# Patient Record
Sex: Male | Born: 1980 | Race: White | Hispanic: No | Marital: Married | Smoking: Never smoker
Health system: Southern US, Community
[De-identification: ages and names within clinical notes are randomized; demographics above are authoritative.]

## PROBLEM LIST (undated history)

## (undated) DIAGNOSIS — K5792 Diverticulitis of intestine, part unspecified, without perforation or abscess without bleeding: Secondary | ICD-10-CM

## (undated) HISTORY — DX: Diverticulitis of intestine, part unspecified, without perforation or abscess without bleeding: K57.92

## (undated) HISTORY — PX: EYE SURGERY: SHX253

## (undated) HISTORY — PX: WISDOM TOOTH EXTRACTION: SHX21

---

## 2003-03-09 ENCOUNTER — Emergency Department (HOSPITAL_COMMUNITY): Admission: EM | Admit: 2003-03-09 | Discharge: 2003-03-09 | Payer: Self-pay | Admitting: *Deleted

## 2013-11-29 ENCOUNTER — Encounter (HOSPITAL_COMMUNITY): Payer: Self-pay | Admitting: Emergency Medicine

## 2013-11-29 ENCOUNTER — Emergency Department (HOSPITAL_COMMUNITY)
Admission: EM | Admit: 2013-11-29 | Discharge: 2013-11-29 | Disposition: A | Discharge status: Home / Self Care | Payer: BC Managed Care – PPO | Source: Home / Self Care | Attending: Family Medicine | Admitting: Family Medicine

## 2013-11-29 DIAGNOSIS — K5792 Diverticulitis of intestine, part unspecified, without perforation or abscess without bleeding: Secondary | ICD-10-CM

## 2013-11-29 DIAGNOSIS — K5732 Diverticulitis of large intestine without perforation or abscess without bleeding: Secondary | ICD-10-CM

## 2013-11-29 LAB — COMPREHENSIVE METABOLIC PANEL
ALBUMIN: 3.9 g/dL (ref 3.5–5.2)
ALK PHOS: 57 U/L (ref 39–117)
ALT: 17 U/L (ref 0–53)
AST: 25 U/L (ref 0–37)
BILIRUBIN TOTAL: 0.8 mg/dL (ref 0.3–1.2)
BUN: 12 mg/dL (ref 6–23)
CHLORIDE: 105 meq/L (ref 96–112)
CO2: 26 mEq/L (ref 19–32)
Calcium: 9.1 mg/dL (ref 8.4–10.5)
Creatinine, Ser: 0.87 mg/dL (ref 0.50–1.35)
GFR calc Af Amer: >90 mL/min (ref >90)
GFR calc non Af Amer: >90 mL/min (ref >90)
Glucose, Bld: 78 mg/dL (ref 70–99)
POTASSIUM: 4 meq/L (ref 3.7–5.3)
SODIUM: 142 meq/L (ref 137–147)
Total Protein: 7.6 g/dL (ref 6.0–8.3)

## 2013-11-29 LAB — POCT URINALYSIS DIP (DEVICE)
BILIRUBIN URINE: NEGATIVE
Glucose, UA: NEGATIVE mg/dL
Hgb urine dipstick: NEGATIVE
Ketones, ur: NEGATIVE mg/dL
LEUKOCYTES UA: NEGATIVE
NITRITE: NEGATIVE
Protein, ur: 30 mg/dL — AB
Specific Gravity, Urine: 1.025 (ref 1.005–1.030)
Urobilinogen, UA: 0.2 mg/dL (ref 0.0–1.0)
pH: 6.5 (ref 5.0–8.0)

## 2013-11-29 LAB — CBC
HCT: 44 % (ref 39.0–52.0)
HEMOGLOBIN: 16.2 g/dL (ref 13.0–17.0)
MCH: 31.2 pg (ref 26.0–34.0)
MCHC: 36.8 g/dL — ABNORMAL HIGH (ref 30.0–36.0)
MCV: 84.6 fL (ref 78.0–100.0)
Platelets: 196 10*3/uL (ref 150–400)
RBC: 5.2 MIL/uL (ref 4.22–5.81)
RDW: 12.4 % (ref 11.5–15.5)
WBC: 7.5 10*3/uL (ref 4.0–10.5)

## 2013-11-29 MED ORDER — CIPROFLOXACIN HCL 500 MG PO TABS: 500.0000 mg | ORAL_TABLET | Freq: Two times a day (BID) | ORAL | Status: DC

## 2013-11-29 MED ORDER — METRONIDAZOLE 500 MG PO TABS: 500.0000 mg | ORAL_TABLET | Freq: Three times a day (TID) | ORAL | Status: DC

## 2013-11-29 NOTE — ED Notes (Addendum)
C/O gradual onset LLQ pain 3 days ago.  Pain constant but at times becomes "stabbing" with any movement or palpation.  Denies n/v/d, fevers.  Last BM yesterday - states was normal; denies constipation.  States normally does a lot of lifting at work.

## 2013-11-29 NOTE — Discharge Instructions (Signed)
Thank you for coming in today. Clear liquid diet.  Antibiotics.  Follow up with a doctor soon.  If your belly pain worsens, or you have high fever, bad vomiting, blood in your stool or black tarry stool go to the Emergency Room.    Diverticulitis A diverticulum is a small pouch or sac on the colon. Diverticulosis is the presence of these diverticula on the colon. Diverticulitis is the irritation (inflammation) or infection of diverticula. CAUSES  The colon and its diverticula contain bacteria. If food particles block the tiny opening to a diverticulum, the bacteria inside can grow and cause an increase in pressure. This leads to infection and inflammation and is called diverticulitis. SYMPTOMS   Abdominal pain and tenderness. Usually, the pain is located on the left side of your abdomen. However, it could be located elsewhere.  Fever.  Bloating.  Feeling sick to your stomach (nausea).  Throwing up (vomiting).  Abnormal stools. DIAGNOSIS  Your caregiver will take a history and perform a physical exam. Since many things can cause abdominal pain, other tests may be necessary. Tests may include:  Blood tests.  Urine tests.  X-ray of the abdomen.  CT scan of the abdomen. Sometimes, surgery is needed to determine if diverticulitis or other conditions are causing your symptoms. TREATMENT  Most of the time, you can be treated without surgery. Treatment includes:  Resting the bowels by only having liquids for a few days. As you improve, you will need to eat a low-fiber diet.  Intravenous (IV) fluids if you are losing body fluids (dehydrated).  Antibiotic medicines that treat infections may be given.  Pain and nausea medicine, if needed.  Surgery if the inflamed diverticulum has burst. HOME CARE INSTRUCTIONS   Try a clear liquid diet (broth, tea, or water for as long as directed by your caregiver). You may then gradually begin a low-fiber diet as tolerated.  A low-fiber diet  is a diet with less than 10 grams of fiber. Choose the foods below to reduce fiber in the diet:  White breads, cereals, rice, and pasta.  Cooked fruits and vegetables or soft fresh fruits and vegetables without the skin.  Ground or well-cooked tender beef, ham, veal, lamb, pork, or poultry.  Eggs and seafood.  After your diverticulitis symptoms have improved, your caregiver may put you on a high-fiber diet. A high-fiber diet includes 14 grams of fiber for every 1000 calories consumed. For a standard 2000 calorie diet, you would need 28 grams of fiber. Follow these diet guidelines to help you increase the fiber in your diet. It is important to slowly increase the amount fiber in your diet to avoid gas, constipation, and bloating.  Choose whole-grain breads, cereals, pasta, and brown rice.  Choose fresh fruits and vegetables with the skin on. Do not overcook vegetables because the more vegetables are cooked, the more fiber is lost.  Choose more nuts, seeds, legumes, dried peas, beans, and lentils.  Look for food products that have greater than 3 grams of fiber per serving on the Nutrition Facts label.  Take all medicine as directed by your caregiver.  If your caregiver has given you a follow-up appointment, it is very important that you go. Not going could result in lasting (chronic) or permanent injury, pain, and disability. If there is any problem keeping the appointment, call to reschedule. SEEK MEDICAL CARE IF:   Your pain does not improve.  You have a hard time advancing your diet beyond clear liquids.  Your  bowel movements do not return to normal. SEEK IMMEDIATE MEDICAL CARE IF:   Your pain becomes worse.  You have an oral temperature above 102 F (38.9 C), not controlled by medicine.  You have repeated vomiting.  You have bloody or black, tarry stools.  Symptoms that brought you to your caregiver become worse or are not getting better. MAKE SURE YOU:   Understand  these instructions.  Will watch your condition.  Will get help right away if you are not doing well or get worse. Document Released: 04/25/2005 Document Revised: 10/08/2011 Document Reviewed: 08/21/2010 [facility] Patient Information 2014 [place].

## 2013-11-29 NOTE — ED Provider Notes (Signed)
Juan Rodriguez is a 33 y.o. male who presents to Urgent Care today for left lower quadrant abdominal pain present for the last 4 days. No fevers chills nausea vomiting diarrhea. No blood in the stool. Patient had a normal bowel movement yesterday. He denies any personal history of similar episodes. His mother has Crohn's disease. He is otherwise healthy. He has not tried any medications yet.   History reviewed. No pertinent past medical history. History  Substance Use Topics  . Smoking status: Never Smoker   . Smokeless tobacco: Not on file  . Alcohol Use: Yes     Comment: occasional   ROS as above Medications: No current facility-administered medications for this encounter.   Current Outpatient Prescriptions  Medication Sig Dispense Refill  . ciprofloxacin (CIPRO) 500 MG tablet Take 1 tablet (500 mg total) by mouth 2 (two) times daily.  20 tablet  0  . metroNIDAZOLE (FLAGYL) 500 MG tablet Take 1 tablet (500 mg total) by mouth 3 (three) times daily.  30 tablet  0    Exam:  BP 125/80  Pulse 75  Temp(Src) 98.5 F (36.9 C) (Oral)  Resp 16  SpO2 100% Gen: Well NAD HEENT: EOMI,  MMM Lungs: Normal work of breathing. CTABL Heart: RRR no MRG Abd: NABS, Soft. Tender palpation left lower quadrant without significant guarding or rebound. Nondistended Exts: Brisk capillary refill, warm and well perfused.   Results for orders placed during the hospital encounter of 11/29/13 (from the past 24 hour(s))  POCT URINALYSIS DIP (DEVICE)     Status: Abnormal   Collection Time    11/29/13  3:58 PM      Result Value Ref Range   Glucose, UA NEGATIVE  NEGATIVE mg/dL   Bilirubin Urine NEGATIVE  NEGATIVE   Ketones, ur NEGATIVE  NEGATIVE mg/dL   Specific Gravity, Urine 1.025  1.005 - 1.030   Hgb urine dipstick NEGATIVE  NEGATIVE   pH 6.5  5.0 - 8.0   Protein, ur 30 (*) NEGATIVE mg/dL   Urobilinogen, UA 0.2  0.0 - 1.0 mg/dL   Nitrite NEGATIVE  NEGATIVE   Leukocytes, UA NEGATIVE  NEGATIVE  CBC      Status: Abnormal   Collection Time    11/29/13  4:01 PM      Result Value Ref Range   WBC 7.5  4.0 - 10.5 K/uL   RBC 5.20  4.22 - 5.81 MIL/uL   Hemoglobin 16.2  13.0 - 17.0 g/dL   HCT 44.0  39.0 - 52.0 %   MCV 84.6  78.0 - 100.0 fL   MCH 31.2  26.0 - 34.0 pg   MCHC 36.8 (*) 30.0 - 36.0 g/dL   RDW 12.4  11.5 - 15.5 %   Platelets 196  150 - 400 K/uL  COMPREHENSIVE METABOLIC PANEL     Status: None   Collection Time    11/29/13  4:01 PM      Result Value Ref Range   Sodium 142  137 - 147 mEq/L   Potassium 4.0  3.7 - 5.3 mEq/L   Chloride 105  96 - 112 mEq/L   CO2 26  19 - 32 mEq/L   Glucose, Bld 78  70 - 99 mg/dL   BUN 12  6 - 23 mg/dL   Creatinine, Ser 0.87  0.50 - 1.35 mg/dL   Calcium 9.1  8.4 - 10.5 mg/dL   Total Protein 7.6  6.0 - 8.3 g/dL   Albumin 3.9  3.5 - 5.2  g/dL   AST 25  0 - 37 U/L   ALT 17  0 - 53 U/L   Alkaline Phosphatase 57  39 - 117 U/L   Total Bilirubin 0.8  0.3 - 1.2 mg/dL   GFR calc non Af Amer >90  >90 mL/min   GFR calc Af Amer >90  >90 mL/min   No results found.  Assessment and Plan: 33 y.o. male with left lower quadrant abdominal pain. Likely diverticulitis. Plan to treat with Cipro and Flagyl. Followup with primary care provider or gastroenterology. She does have a family history significant for Crohn's disease. If this does not resolve referral to gastroenterology is reasonable.  Discussed warning signs or symptoms. Please see discharge instructions. Patient expresses understanding.    Gregor Hams, MD 11/29/13 [phone]

## 2015-01-05 ENCOUNTER — Encounter: Payer: Self-pay | Admitting: Family

## 2015-01-05 ENCOUNTER — Telehealth: Payer: Self-pay | Admitting: *Deleted

## 2015-01-05 ENCOUNTER — Ambulatory Visit (INDEPENDENT_AMBULATORY_CARE_PROVIDER_SITE_OTHER): Payer: BLUE CROSS/BLUE SHIELD | Admitting: Family

## 2015-01-05 VITALS — BP 122/89 | HR 60 | Temp 98.3°F | Resp 10 | Ht 68.0 in | Wt 208.0 lb

## 2015-01-05 DIAGNOSIS — R1032 Left lower quadrant pain: Secondary | ICD-10-CM | POA: Insufficient documentation

## 2015-01-05 NOTE — Progress Notes (Signed)
Pre visit review using our clinic review tool, if applicable. No additional management support is needed unless otherwise documented below in the visit note. 

## 2015-01-05 NOTE — Patient Instructions (Addendum)
Thank you for choosing [facility].  Summary/Instructions:  Please stop by the lab on the basement level of the building for your blood work. Your results will be released to [place] (or called to you) after review, usually within 72 hours after test completion. If any changes need to be made, you will be notified at that same time.  Referrals have been made during this visit. You should expect to hear back from our schedulers in about 7-10 days in regards to establishing an appointment with the specialists we discussed.   If your symptoms worsen or fail to improve, please contact our office for further instruction, or in case of emergency go directly to the emergency room at the closest medical facility.    Colitis Colitis is inflammation of the colon. Colitis can be a short-term or long-standing (chronic) illness. Crohn's disease and ulcerative colitis are 2 types of colitis which are chronic. They usually require lifelong treatment. CAUSES  There are many different causes of colitis, including:  Viruses.  Germs (bacteria).  Medicine reactions. SYMPTOMS   Diarrhea.  Intestinal bleeding.  Pain.  Fever.  Throwing up (vomiting).  Tiredness (fatigue).  Weight loss.  Bowel blockage. DIAGNOSIS  The diagnosis of colitis is based on examination and stool or blood tests. X-rays, CT scan, and colonoscopy may also be needed. TREATMENT  Treatment may include:  Fluids given through the vein (intravenously).  Bowel rest (nothing to eat or drink for a period of time).  Medicine for pain and diarrhea.  Medicines (antibiotics) that kill germs.  Cortisone medicines.  Surgery. HOME CARE INSTRUCTIONS   Get plenty of rest.  Drink enough water and fluids to keep your urine clear or pale yellow.  Eat a well-balanced diet.  Call your caregiver for follow-up as recommended. SEEK IMMEDIATE MEDICAL CARE IF:   You develop chills.  You have an oral temperature above  102 F (38.9 C), not controlled by medicine.  You have extreme weakness, fainting, or dehydration.  You have repeated vomiting.  You develop severe belly (abdominal) pain or are passing bloody or tarry stools. MAKE SURE YOU:   Understand these instructions.  Will watch your condition.  Will get help right away if you are not doing well or get worse. Document Released: 08/23/2004 Document Revised: 10/08/2011 Document Reviewed: 11/18/2009 [facility] Patient Information 2015 [place], [place]. This information is not intended to replace advice given to you by your health care provider. Make sure you discuss any questions you have with your health care provider.  Crohn Disease Crohn disease is a long-term (chronic) soreness and redness (inflammation) of the intestines (bowel). It can affect any portion of the digestive tract, from the mouth to the anus. It can also cause problems outside the digestive tract. Crohn disease is closely related to a disease called ulcerative colitis (together, these two diseases are called inflammatory bowel disease).  CAUSES  The cause of Crohn disease is not known. One Link Snuffer is that, in an easily affected person, the immune system is triggered to attack the body's own digestive tissue. Crohn disease runs in families. It seems to be more common in certain geographic areas and amongst certain races. There are no clear-cut dietary causes.  SYMPTOMS  Crohn disease can cause many different symptoms since it can affect many different parts of the body. Symptoms include:  Fatigue.  Weight loss.  Chronic diarrhea, sometime bloody.  Abdominal pain and cramps.  Fever.  Ulcers or canker sores in the mouth or rectum.  Anemia (  low red blood cells).  Arthritis, skin problems, and eye problems may occur. Complications of Crohn disease can include:  Series of holes (perforation) of the bowel.  Portions of the intestines sticking to each other  (adhesions).  Obstruction of the bowel.  Fistula formation, typically in the rectal area but also in other areas. A fistula is an opening between the bowels and the outside, or between the bowels and another organ.  A painful crack in the mucous membrane of the anus (rectal fissure). DIAGNOSIS  Your caregiver may suspect Crohn disease based on your symptoms and an exam. Blood tests may confirm that there is a problem. You may be asked to submit a stool specimen for examination. X-rays and CT scans may be necessary. Ultimately, the diagnosis is usually made after a procedure that uses a flexible tube that is inserted via your mouth or your anus. This is done under sedation and is called either an upper endoscopy or colonoscopy. With these tests, the specialist can take tiny tissue samples and remove them from the inside of the bowel (biopsy). Examination of this biopsy tissue under a microscope can reveal Crohn disease as the cause of your symptoms. Due to the many different forms that Crohn disease can take, symptoms may be present for several years before a diagnosis is made. TREATMENT  Medications are often used to decrease inflammation and control the immune system. These include medicines related to aspirin, steroid medications, and newer and stronger medications to slow down the immune system. Some medications may be used as suppositories or enemas. A number of other medications are used or have been studied. Your caregiver will make specific recommendations. HOME CARE INSTRUCTIONS   Symptoms such as diarrhea can be controlled with medications. Avoid foods that have a laxative effect such as fresh fruit, vegetables, and dairy products. During flare-ups, you can rest your bowel by refraining from solid foods. Drink clear liquids frequently during the day. (Electrolyte or rehydrating fluids are best. Your caregiver can help you with suggestions.) Drink often to prevent loss of body fluids  (dehydration). When diarrhea has cleared, eat small meals and more frequently. Avoid food additives and stimulants such as caffeine (coffee, tea, or chocolate). Enzyme supplements may help if you develop intolerance to a sugar in dairy products (lactose). Ask your caregiver or dietitian about specific dietary instructions.  Try to maintain a positive attitude. Learn relaxation techniques such as self-hypnosis, mental imaging, and muscle relaxation.  If possible, avoid stresses which can aggravate your condition.  Exercise regularly.  Follow your diet.  Always get plenty of rest. SEEK MEDICAL CARE IF:   Your symptoms fail to improve after a week or two of new treatment.  You experience continued weight loss.  You have ongoing cramps or loose bowels.  You develop a new skin rash, skin sores, or eye problems. SEEK IMMEDIATE MEDICAL CARE IF:   You have worsening of your symptoms or develop new symptoms.  You have a fever.  You develop bloody diarrhea.  You develop severe abdominal pain. MAKE SURE YOU:   Understand these instructions.  Will watch your condition.  Will get help right away if you are not doing well or get worse. Document Released: 04/25/2005 Document Revised: 11/30/2013 Document Reviewed: 03/24/2007 [facility] Patient Information 2015 [place], [place]. This information is not intended to replace advice given to you by your health care provider. Make sure you discuss any questions you have with your health care provider.  Ulcerative Colitis Ulcerative colitis is  a long lasting swelling and soreness (inflammation) of the colon (large intestine). In patients with ulcerative colitis, sores (ulcers) and inflammation of the inner lining of the colon lead to illness. Ulcerative colitis can also cause problems outside the digestive tract.  Ulcerative colitis is closely related to another condition of inflammation of the intestines called Crohn's disease. Together, they are  frequently referred to as inflammatory bowel disease (IBD). Ulcerative colitis and Crohn's diseases are conditions that can last years to decades. Men and women are affected equally. They most commonly begin during adolescence and early adulthood. SYMPTOMS  Common symptoms of ulcerative colitis include rectal bleeding and diarrhea. There is a wide range of symptoms among patients with this disease depending on how severe the disease is. Some of these symptoms are:  Abdominal pain or cramping.  Diarrhea.  Fever.  Tiredness (fatigue).  Weight loss.  Night sweats.  Rectal pain.  Feeling the immediate need to have a bowel movement (rectal urgency). CAUSES  Ulcerative colitis is caused by increased activity of the immune system in the intestines. The immune system is the system that protects the body against disease such as harmful bacteria, viruses, fungi, and other foreign invaders. When the immune system overacts, it causes inflammation. The cause of the increased immune system activity is not known. This over activity causes long-lasting inflammation and ulceration. This condition may be passed down from your parents (inherited). Brothers, sisters, children, and parents of patients with IBD are more likely to develop these diseases. It is not contagious. This means you cannot catch it from someone else. DIAGNOSIS  Your caregiver may suspect ulcerative colitis based on your symptoms and exam. Blood tests may confirm that there is a problem. You may be asked to submit a stool specimen for examination. X-rays and CT scans may be necessary. Ultimately, the diagnosis is usually made after a flexible tube is inserted via your anus and your colon is examined under sedation (colonoscopy). With this test, the specialist can take a tiny tissue sample from inside the bowel (biopsy). Examination of this biopsy tissue under a microscopy can reveal ulcerative colitis as the cause of your symptoms. TREATMENT    There is no cure for ulcerative colitis.  Complications such as massive bleeding from the colon (hemorrhage), development of a hole in the colon (perforation), or the development of precancerous or cancerous changes of the colon may require surgery.  Medications are often used to decrease inflammation and control the immune system. These include medicines related to aspirin, steroid medications, and newer and stronger medications to slow down the immune system. Some medications may be used as suppositories or enemas. A number of other medications are used or have been studied. Your caregiver will make specific recommendations. HOME CARE INSTRUCTIONS   There is no cure for ulcerative colitis disease. The best treatment is frequent checkups with your caregiver. Periodic reevaluation is important.  Symptoms such as diarrhea can be controlled with medications. Avoid foods that have a laxative effect such fresh fruit and vegetables and dairy products. During flare ups, you can rest your bowel by staying away from solid foods. Drink clear liquids frequently during the day. Electrolyte or rehydrating fluids are best. Your caregiver can help you with suggestions. Drink often to prevent dehydration. When diarrhea has cleared, eat smaller meals and more often. Avoid food additives and stimulants such as caffeine (coffee, tea, many sodas, or chocolate). Avoid dairy products. Enzyme supplements may help if you develop intolerance to a sugar in dairy  products (lactose). Ask your caregiver or dietitian about specific dietary instructions.  If you had surgery, be sure you understand your care instructions thoroughly, including proper care of any surgical wounds.  Take any medications exactly as prescribed.  Try to maintain a positive attitude. Learn relaxation techniques such as self hypnosis, mental imaging, and muscle relaxation. If possible, avoid stresses that aggravate your condition. Exercise regularly.  Follow your diet. Always get plenty of rest. SEEK MEDICAL CARE IF:   Your symptoms fail to improve after a week or two of new treatment.  You experience continued weight loss.  You have ongoing crampy digestion or loose bowels.  You develop a new skin rash, skin sores, or eye problems. SEEK IMMEDIATE MEDICAL CARE IF:   You have worsening of your symptoms or develop new symptoms.  You have an oral temperature above 102 F (38.9 C), not controlled by medicine.  You develop bloody diarrhea.  You have severe abdominal pain. Document Released: 04/25/2005 Document Revised: 10/08/2011 Document Reviewed: 03/25/2007 [facility] Patient Information 2015 [place], [place]. This information is not intended to replace advice given to you by your health care provider. Make sure you discuss any questions you have with your health care provider.  Diverticulitis Diverticulitis is inflammation or infection of small pouches in your colon that form when you have a condition called diverticulosis. The pouches in your colon are called diverticula. Your colon, or large intestine, is where water is absorbed and stool is formed. Complications of diverticulitis can include:  Bleeding.  Severe infection.  Severe pain.  Perforation of your colon.  Obstruction of your colon. CAUSES  Diverticulitis is caused by bacteria. Diverticulitis happens when stool becomes trapped in diverticula. This allows bacteria to grow in the diverticula, which can lead to inflammation and infection. RISK FACTORS People with diverticulosis are at risk for diverticulitis. Eating a diet that does not include enough fiber from fruits and vegetables may make diverticulitis more likely to develop. SYMPTOMS  Symptoms of diverticulitis may include:  Abdominal pain and tenderness. The pain is normally located on the left side of the abdomen, but may occur in other areas.  Fever and  chills.  Bloating.  Cramping.  Nausea.  Vomiting.  Constipation.  Diarrhea.  Blood in your stool. DIAGNOSIS  Your health care provider will ask you about your medical history and do a physical exam. You may need to have tests done because many medical conditions can cause the same symptoms as diverticulitis. Tests may include:  Blood tests.  Urine tests.  Imaging tests of the abdomen, including X-rays and CT scans. When your condition is under control, your health care provider may recommend that you have a colonoscopy. A colonoscopy can show how severe your diverticula are and whether something else is causing your symptoms. TREATMENT  Most cases of diverticulitis are mild and can be treated at home. Treatment may include:  Taking over-the-counter pain medicines.  Following a clear liquid diet.  Taking antibiotic medicines by mouth for 7-10 days. More severe cases may be treated at a hospital. Treatment may include:  Not eating or drinking.  Taking prescription pain medicine.  Receiving antibiotic medicines through an IV tube.  Receiving fluids and nutrition through an IV tube.  Surgery. HOME CARE INSTRUCTIONS   Follow your health care provider's instructions carefully.  Follow a full liquid diet or other diet as directed by your health care provider. After your symptoms improve, your health care provider may tell you to change your diet. He  or she may recommend you eat a high-fiber diet. Fruits and vegetables are good sources of fiber. Fiber makes it easier to pass stool.  Take fiber supplements or probiotics as directed by your health care provider.  Only take medicines as directed by your health care provider.  Keep all your follow-up appointments. SEEK MEDICAL CARE IF:   Your pain does not improve.  You have a hard time eating food.  Your bowel movements do not return to normal. SEEK IMMEDIATE MEDICAL CARE IF:   Your pain becomes worse.  Your  symptoms do not get better.  Your symptoms suddenly get worse.  You have a fever.  You have repeated vomiting.  You have bloody or black, tarry stools. MAKE SURE YOU:   Understand these instructions.  Will watch your condition.  Will get help right away if you are not doing well or get worse. Document Released: 04/25/2005 Document Revised: 07/21/2013 Document Reviewed: 06/10/2013 [facility] Patient Information 2015 [place], [place]. This information is not intended to replace advice given to you by your health care provider. Make sure you discuss any questions you have with your health care provider.

## 2015-01-05 NOTE — Progress Notes (Deleted)
   Subjective:    Patient ID: Juan Rodriguez, male    DOB: 1980-09-15, 34 y.o.   MRN: [id number]  HPI    Review of Systems     Objective:   Physical Exam        Assessment & Plan:

## 2015-01-05 NOTE — Telephone Encounter (Signed)
Sure, or before if cancellation.

## 2015-01-05 NOTE — Telephone Encounter (Signed)
Urgent referral for patient from Mauricio Po, NP. Patient has LLQ pain, weight loss, diarrhea. No appointments available with MD or extender until 01/14/15. DOD- Dr. Olevia Perches. Is this ok?

## 2015-01-05 NOTE — Progress Notes (Signed)
Subjective:    Patient ID: Juan Rodriguez, male    DOB: 1981-06-29, 34 y.o.   MRN: [id number]  Chief Complaint  Patient presents with  . Flank Pain    Left Lower Quadrant    HPI:  Juan Rodriguez is a 34 y.o. male with a PMH of diverticulitis who presents today for an office visit to establish care.  1.) Flank pain - Previously diagnosed with diverticulitis last year when he was seen at Urgent Care and treated with Ciprofloxacin and Metronidazole. Notes that the symptoms went away following treatment. Recently was seen in an ED for the LLQ pain and a CT scan reveled inflammation consistent with colitis and possible ulcerative colitis. He was treated once again with Ciprofloxacin and Metronidzale which helped decrease the pain, however continues to experience the associated symptoms of pain located in the lower left quadrant that is described as a feeling like being "punched in the gut." Severity of the pain is rated a 2/10.  Up until yesterday, he was having up to 10 bowel movements per day and now about 5-6 bowel movements per day. Has lost approximately 25 pounds since this initially started.     No Known Allergies   Outpatient Prescriptions Prior to Visit  Medication Sig Dispense Refill  . ciprofloxacin (CIPRO) 500 MG tablet Take 1 tablet (500 mg total) by mouth 2 (two) times daily. (Patient not taking: Reported on 01/05/2015) 20 tablet 0  . metroNIDAZOLE (FLAGYL) 500 MG tablet Take 1 tablet (500 mg total) by mouth 3 (three) times daily. (Patient not taking: Reported on 01/05/2015) 30 tablet 0   No facility-administered medications prior to visit.     Past Medical History  Diagnosis Date  . Diverticulitis      Past Surgical History  Procedure Laterality Date  . Eye surgery      Lasix     Family History  Problem Relation Age of Onset  . Crohn's disease Mother   . Healthy Father   . Alzheimer's disease Maternal Grandmother   . Healthy Paternal Grandmother      History    Social History  . Marital Status: Married    Spouse Name: N/A  . Number of Children: 2  . Years of Education: 14   Occupational History  . Glass blower/designer    Social History Main Topics  . Smoking status: Never Smoker   . Smokeless tobacco: Never Used  . Alcohol Use: Yes     Comment: occasional  . Drug Use: No  . Sexual Activity: Yes   Other Topics Concern  . Not on file   Social History Narrative   Fun: Whatever his kids want to do.   Kids ages 31 & 80   Denies religious beliefs effecting health care.      Review of Systems  Constitutional: Negative for fever and chills.  Gastrointestinal: Positive for abdominal pain, diarrhea and abdominal distention.      Objective:    BP 122/89 mmHg  Pulse 60  Temp(Src) 98.3 F (36.8 C) (Oral)  Resp 10  Ht 5\' 8"  (1.727 m)  Wt 208 lb (94.348 kg)  BMI 31.63 kg/m2 Nursing note and vital signs reviewed.  Physical Exam  Constitutional: He is oriented to person, place, and time. He appears well-developed and well-nourished. No distress.  Cardiovascular: Normal rate, regular rhythm, normal heart sounds and intact distal pulses.   Pulmonary/Chest: Effort normal and breath sounds normal.  Abdominal: Normal appearance and bowel sounds are normal.  He exhibits no mass. There is no hepatosplenomegaly. There is tenderness in the periumbilical area and left lower quadrant. There is no rigidity, no rebound, no guarding, no tenderness at McBurney's point and negative Murphy's sign.  Neurological: He is alert and oriented to person, place, and time.  Skin: Skin is warm and dry.  Psychiatric: He has a normal mood and affect. His behavior is normal. Judgment and thought content normal.       Assessment & Plan:   Problem List Items Addressed This Visit      Other   LLQ pain - Primary    Symptoms and exam consistent with colitis that was seen on CT scan through an emergency room visit at an outside hospital. Obtain basic metabolic panel  check her kidney function and electrolytes. Hydrate with sports drinks and water as needed. Referred to gastroenterology for further management and potential colonoscopy. Patient advised to seek further emergency care if symptoms of dehydration develop or diarrhea worsens.      Relevant Orders   Basic Metabolic Panel (BMET)   Ambulatory referral to Gastroenterology

## 2015-01-05 NOTE — Telephone Encounter (Signed)
Scheduled with Nicoletta Ba, PA on 01/14/15 at 2:00 PM. Dr. Olevia Perches DOD says this is ok.

## 2015-01-05 NOTE — Assessment & Plan Note (Signed)
Symptoms and exam consistent with colitis that was seen on CT scan through an emergency room visit at an outside hospital. Obtain basic metabolic panel check her kidney function and electrolytes. Hydrate with sports drinks and water as needed. Referred to gastroenterology for further management and potential colonoscopy. Patient advised to seek further emergency care if symptoms of dehydration develop or diarrhea worsens.

## 2015-01-06 NOTE — Telephone Encounter (Signed)
Left a message on answering machine with appointment.

## 2015-01-10 ENCOUNTER — Telehealth: Payer: Self-pay | Admitting: Family

## 2015-01-10 MED ORDER — AMOXICILLIN-POT CLAVULANATE 875-125 MG PO TABS: 1.0000 | ORAL_TABLET | Freq: Two times a day (BID) | ORAL | Status: DC

## 2015-01-10 NOTE — Telephone Encounter (Signed)
Patient ask if you could send a a prescription for an antibiotic to [facility] on [address] in [place]. Please advise

## 2015-01-10 NOTE — Telephone Encounter (Signed)
Pt states that at his last visit you said you would send him an antibiotic for his abdominal pain but he refused it bc he had another docs appointment but he had to reschedule that so he is wanting to see if you would go ahead and send him in the antibiotic you were talking to him about. Please advise.

## 2015-01-10 NOTE — Telephone Encounter (Signed)
Medication sent.

## 2015-01-10 NOTE — Addendum Note (Signed)
Addended by: Mauricio Po D on: 01/10/2015 01:00 PM   Modules accepted: Orders

## 2015-01-14 ENCOUNTER — Encounter: Payer: Self-pay | Admitting: Physician Assistant

## 2015-01-14 ENCOUNTER — Ambulatory Visit (INDEPENDENT_AMBULATORY_CARE_PROVIDER_SITE_OTHER): Payer: BLUE CROSS/BLUE SHIELD | Admitting: Physician Assistant

## 2015-01-14 VITALS — BP 112/70 | Ht 68.0 in | Wt 207.5 lb

## 2015-01-14 DIAGNOSIS — R197 Diarrhea, unspecified: Secondary | ICD-10-CM

## 2015-01-14 DIAGNOSIS — R933 Abnormal findings on diagnostic imaging of other parts of digestive tract: Secondary | ICD-10-CM

## 2015-01-14 DIAGNOSIS — R1032 Left lower quadrant pain: Secondary | ICD-10-CM

## 2015-01-14 DIAGNOSIS — R1031 Right lower quadrant pain: Secondary | ICD-10-CM | POA: Diagnosis not present

## 2015-01-14 DIAGNOSIS — G8929 Other chronic pain: Secondary | ICD-10-CM

## 2015-01-14 HISTORY — DX: Diarrhea, unspecified: R19.7

## 2015-01-14 MED ORDER — NA SULFATE-K SULFATE-MG SULF 17.5-3.13-1.6 GM/177ML PO SOLN: 1.0000 | Freq: Once | ORAL | Status: AC

## 2015-01-14 MED ORDER — DICYCLOMINE HCL 10 MG PO CAPS: 10.0000 mg | ORAL_CAPSULE | Freq: Three times a day (TID) | ORAL | Status: DC

## 2015-01-14 NOTE — Progress Notes (Signed)
Reviewed and agree with management plan.  Ramiro Pangilinan T. Deauna Yaw, MD FACG 

## 2015-01-14 NOTE — Progress Notes (Signed)
Patient ID: ALDRICK DERRIG, male   DOB: 1981-07-02, 34 y.o.   MRN: [id number]   Subjective:    Patient ID: SOMA LIZAK, male    DOB: [birth year], 34 y.o.   MRN: [id number]  HPI Juliane Lack is a very nice 34 year old white male generally in good health, new to GI today referred by Mauricio Po NP/primary care for evaluation of lower abdominal pain diarrhea and weight loss. Patient states that he had an episode of lower abdominal pain about a year ago and at that time was treated for possible diverticulitis. About a month ago he was vacationing in [place] and developed left lower abdominal pain and diarrhea and was seen at an urgent care there. He underwent CT of the abdomen and pelvis which by the primary care notes did not show diverticulitis that did show colitis with concern for ulcerative colitis. This report has been sent to be scanned and I have not been able to review the report myself. The patient had been given a course of Cipro and Flagyl through that urgent care he says he gradually did feel better had no associated fever or chills no rectal bleeding no nausea or vomiting. Within a week of finishing the antibiotics he had recurrence of lower abdominal discomfort and says that his diarrhea has never really changed since onset. He was seen by primary care earlier this week and placed on Augmentin. He says is generally having 10-12 loose bowel movements per day which are urgent and nonbloody has no significant pain but has crampy lower abdominal discomfort. He says his lost about 20 pounds over the past month and a half as he is eating much less knowing that he will have abdominal discomfort and diarrhea after eating. Patient does have family history of Crohn's disease in his mom.  Review of Systems Pertinent positive and negative review of systems were noted in the above HPI section.  All other review of systems was otherwise negative.  Outpatient Encounter Prescriptions as of  01/14/2015  Medication Sig  . amoxicillin-clavulanate (AUGMENTIN) 875-125 MG per tablet Take 1 tablet by mouth 2 (two) times daily.  Marland Kitchen dicyclomine (BENTYL) 10 MG capsule Take 1 capsule (10 mg total) by mouth 3 (three) times daily before meals.  . Na Sulfate-K Sulfate-Mg Sulf SOLN Take 1 kit by mouth once.   No facility-administered encounter medications on file as of 01/14/2015.   No Known Allergies Patient Active Problem List   Diagnosis Date Noted  . Diarrhea 01/14/2015  . LLQ pain 01/05/2015   History   Social History  . Marital Status: Married    Spouse Name: N/A  . Number of Children: 2  . Years of Education: 14   Occupational History  . Glass blower/designer    Social History Main Topics  . Smoking status: Never Smoker   . Smokeless tobacco: Never Used  . Alcohol Use: Yes     Comment: occasional  . Drug Use: No  . Sexual Activity: Yes   Other Topics Concern  . Not on file   Social History Narrative   Fun: Whatever his kids want to do.   Kids ages 27 & 79   Denies religious beliefs effecting health care.     Mr. Boyte family history includes Alzheimer's disease in his maternal grandmother; Crohn's disease in his mother; Healthy in his father and paternal grandmother.      Objective:    Filed Vitals:   01/14/15 1335  BP: 112/70    Physical  Exam    well-developed white male in no acute distress, pleasant blood pressure 112/70 pulse in the 70s height 5 foot 8 weight 207 BMI 31. HEENT; nontraumatic normocephalic EOMI PERRLA sclera anicteric, Supple ;no JVD, Cardiovascular; regular rate and rhythm with S1-S2 no murmur or gallop, Pulmonary; clear bilaterally, Abdomen; soft bowel sounds are present he is tender in the lower abdomen right greater than left no guarding or rebound no palpable mass or hepatosplenomegaly, Rectal ;exam not done, Ext; no clubbing cyanosis or edema skin warm and dry, Psych; mood and affect appropriate       Assessment & Plan:   #1 34 yo male  with 5 week hx of persistent lower abdominal  pain/cramping , and diarrhea - by report had abnormal CT scan done in [place] showing colitis concerning for ulcerative colitis/ no diverticulitis. R/O IBD, R/O cdiff  after 2 recent courses of antibiotics.  #2 family hx of IBD- mother with Crohns  Plan; stop Augmentin  start Florastor BID x one month  Start Bentyl 10 mg 3 x daily before meals  Check CBC,BMET,ESR,CRP, stool lactoferrin and stool for Cdiff PCR  Schedule for Colonoscopy with Dr Fuller Plan. Procedure discussed in detail with pt and he is agreeable to proceed    Alfredia Ferguson PA-C 01/14/2015   Cc: Golden Circle, FNP

## 2015-01-14 NOTE — Patient Instructions (Addendum)
Please go to the basement level to have your labs drawn and stool studies. We sent  prescriptions to [facility] [place]. 1. Suprep for the colonoscopy 2.  Bentyl ( Dicyclomine).   Use Imodium as needed . Stop the augmentin antibiortic.Marland Kitchen Push fluids and eat a bland diet for now.

## 2015-03-21 ENCOUNTER — Encounter: Payer: Self-pay | Admitting: Gastroenterology

## 2015-03-21 ENCOUNTER — Ambulatory Visit (AMBULATORY_SURGERY_CENTER): Payer: BLUE CROSS/BLUE SHIELD | Admitting: Gastroenterology

## 2015-03-21 VITALS — BP 111/71 | HR 65 | Temp 97.3°F | Resp 18 | Ht 68.0 in | Wt 207.0 lb

## 2015-03-21 DIAGNOSIS — R933 Abnormal findings on diagnostic imaging of other parts of digestive tract: Secondary | ICD-10-CM | POA: Diagnosis present

## 2015-03-21 DIAGNOSIS — R197 Diarrhea, unspecified: Secondary | ICD-10-CM

## 2015-03-21 DIAGNOSIS — R1032 Left lower quadrant pain: Secondary | ICD-10-CM | POA: Diagnosis not present

## 2015-03-21 DIAGNOSIS — R198 Other specified symptoms and signs involving the digestive system and abdomen: Secondary | ICD-10-CM

## 2015-03-21 DIAGNOSIS — R1031 Right lower quadrant pain: Secondary | ICD-10-CM

## 2015-03-21 MED ORDER — SODIUM CHLORIDE 0.9 % IV SOLN: 500.0000 mL | INTRAVENOUS | Status: DC

## 2015-03-21 NOTE — Op Note (Signed)
[place]  [address]. [place], [id number]   COLONOSCOPY PROCEDURE REPORT  PATIENT: Juan Rodriguez, Juan Rodriguez  MR#: [id number] BIRTHDATE: 1980-11-25 , 34  yrs. old GENDER: male ENDOSCOPIST: Ladene Artist, MD, [facility] REFERRED BY: Mauricio Po, FNP PROCEDURE DATE:  03/21/2015 PROCEDURE:   Colonoscopy, diagnostic First Screening Colonoscopy - Avg.  risk and is 50 yrs.  old or older - No.  Prior Negative Screening - Now for repeat screening. N/A  History of Adenoma - Now for follow-up colonoscopy & has been > or = to 3 yrs.  N/A  Polyps removed today? No Recommend repeat exam, <10 yrs? No ASA CLASS:   Class II INDICATIONS:Clinically significant diarrhea of unexplained origin, Colorectal Neoplasm Risk Assessment for this procedure is average risk, and lower abdominal pain, abnormal CT of colon. MEDICATIONS: Monitored anesthesia care and Propofol 200 mg IV DESCRIPTION OF PROCEDURE:   After the risks benefits and alternatives of the procedure were thoroughly explained, informed consent was obtained.  The digital rectal exam revealed no abnormalities of the rectum.   The LB [id number]  endoscope was introduced through the anus and advanced to the terminal ileum which was intubated for a short distance. No adverse events experienced.   The quality of the prep was excellent.  (Suprep was used)  The instrument was then slowly withdrawn as the colon was fully examined. Estimated blood loss is zero unless otherwise noted in this procedure report.    COLON FINDINGS: A normal appearing cecum, ileocecal valve, and appendiceal orifice were identified.  The ascending, transverse, descending, sigmoid colon, and rectum appeared unremarkable.   The examined terminal ileum appeared to be normal.  Retroflexed views revealed no abnormalities. The time to cecum = 1.1 Withdrawal time = 7.5   The scope was withdrawn and the procedure completed. COMPLICATIONS: There were no immediate  complications.  ENDOSCOPIC IMPRESSION: 1.   Normal colonoscopy 2.   The examined terminal ileum appeared normal  RECOMMENDATIONS: 1. Continue current colorectal screening recommendations for "routine risk" patients with a repeat colonoscopy at age 72. 2. GI follow as needed  eSigned:  Ladene Artist, MD, [facility] 03/21/2015 8:20 AM

## 2015-03-21 NOTE — Patient Instructions (Signed)
Discharge instructions given. Normal exam. Resume previous medications. YOU HAD AN ENDOSCOPIC PROCEDURE TODAY AT THE [facility]:   Refer to the procedure report that was given to you for any specific questions about what was found during the examination.  If the procedure report does not answer your questions, please call your gastroenterologist to clarify.  If you requested that your care partner not be given the details of your procedure findings, then the procedure report has been included in a sealed envelope for you to review at your convenience later.  YOU SHOULD EXPECT: Some feelings of bloating in the abdomen. Passage of more gas than usual.  Walking can help get rid of the air that was put into your GI tract during the procedure and reduce the bloating. If you had a lower endoscopy (such as a colonoscopy or flexible sigmoidoscopy) you may notice spotting of blood in your stool or on the toilet paper. If you underwent a bowel prep for your procedure, you may not have a normal bowel movement for a few days.  Please Note:  You might notice some irritation and congestion in your nose or some drainage.  This is from the oxygen used during your procedure.  There is no need for concern and it should clear up in a day or so.  SYMPTOMS TO REPORT IMMEDIATELY:   Following lower endoscopy (colonoscopy or flexible sigmoidoscopy):  Excessive amounts of blood in the stool  Significant tenderness or worsening of abdominal pains  Swelling of the abdomen that is new, acute  Fever of 100F or higher   For urgent or emergent issues, a gastroenterologist can be reached at any hour by calling [phone].   DIET: Your first meal following the procedure should be a small meal and then it is ok to progress to your normal diet. Heavy or fried foods are harder to digest and may make you feel nauseous or bloated.  Likewise, meals heavy in dairy and vegetables can increase bloating.  Drink plenty  of fluids but you should avoid alcoholic beverages for 24 hours.  ACTIVITY:  You should plan to take it easy for the rest of today and you should NOT DRIVE or use heavy machinery until tomorrow (because of the sedation medicines used during the test).    FOLLOW UP: Our staff will call the number listed on your records the next business day following your procedure to check on you and address any questions or concerns that you may have regarding the information given to you following your procedure. If we do not reach you, we will leave a message.  However, if you are feeling well and you are not experiencing any problems, there is no need to return our call.  We will assume that you have returned to your regular daily activities without incident.  If any biopsies were taken you will be contacted by phone or by letter within the next 1-3 weeks.  Please call us at [phone] if you have not heard about the biopsies in 3 weeks.    SIGNATURES/CONFIDENTIALITY: You and/or your care partner have signed paperwork which will be entered into your electronic medical record.  These signatures attest to the fact that that the information above on your After Visit Summary has been reviewed and is understood.  Full responsibility of the confidentiality of this discharge information lies with you and/or your care-partner. 

## 2015-03-21 NOTE — Progress Notes (Signed)
Report to PACU, RN, vss, BBS= Clear.  

## 2015-03-22 ENCOUNTER — Telehealth: Payer: Self-pay

## 2015-03-22 NOTE — Telephone Encounter (Signed)
Left message on answering machine. 

## 2016-10-23 DIAGNOSIS — Z713 Dietary counseling and surveillance: Secondary | ICD-10-CM | POA: Diagnosis not present

## 2016-10-23 DIAGNOSIS — Z0000 Encounter for general adult medical examination without abnormal findings: Secondary | ICD-10-CM | POA: Diagnosis not present

## 2016-10-23 DIAGNOSIS — Z6835 Body mass index (BMI) 35.0-35.9, adult: Secondary | ICD-10-CM | POA: Diagnosis not present

## 2016-10-23 DIAGNOSIS — Z7182 Exercise counseling: Secondary | ICD-10-CM | POA: Diagnosis not present

## 2016-10-23 DIAGNOSIS — Z7689 Persons encountering health services in other specified circumstances: Secondary | ICD-10-CM | POA: Diagnosis not present

## 2016-12-18 DIAGNOSIS — J329 Chronic sinusitis, unspecified: Secondary | ICD-10-CM | POA: Diagnosis not present

## 2016-12-18 DIAGNOSIS — J111 Influenza due to unidentified influenza virus with other respiratory manifestations: Secondary | ICD-10-CM | POA: Diagnosis not present

## 2016-12-18 DIAGNOSIS — J029 Acute pharyngitis, unspecified: Secondary | ICD-10-CM | POA: Diagnosis not present

## 2017-01-17 DIAGNOSIS — L739 Follicular disorder, unspecified: Secondary | ICD-10-CM | POA: Diagnosis not present

## 2017-01-17 DIAGNOSIS — D225 Melanocytic nevi of trunk: Secondary | ICD-10-CM | POA: Diagnosis not present

## 2017-01-17 DIAGNOSIS — D485 Neoplasm of uncertain behavior of skin: Secondary | ICD-10-CM | POA: Diagnosis not present

## 2017-01-17 DIAGNOSIS — D235 Other benign neoplasm of skin of trunk: Secondary | ICD-10-CM | POA: Diagnosis not present

## 2017-06-07 DIAGNOSIS — M545 Low back pain: Secondary | ICD-10-CM | POA: Diagnosis not present

## 2017-06-07 DIAGNOSIS — M9903 Segmental and somatic dysfunction of lumbar region: Secondary | ICD-10-CM | POA: Diagnosis not present

## 2017-06-07 DIAGNOSIS — I10 Essential (primary) hypertension: Secondary | ICD-10-CM | POA: Diagnosis not present

## 2017-06-07 DIAGNOSIS — M9901 Segmental and somatic dysfunction of cervical region: Secondary | ICD-10-CM | POA: Diagnosis not present

## 2017-06-07 DIAGNOSIS — M9902 Segmental and somatic dysfunction of thoracic region: Secondary | ICD-10-CM | POA: Diagnosis not present

## 2018-01-16 ENCOUNTER — Ambulatory Visit (INDEPENDENT_AMBULATORY_CARE_PROVIDER_SITE_OTHER): Payer: Self-pay | Admitting: Nurse Practitioner

## 2018-01-16 VITALS — BP 138/88 | HR 97 | Temp 98.7°F | Resp 18 | Wt 236.4 lb

## 2018-01-16 DIAGNOSIS — Z0000 Encounter for general adult medical examination without abnormal findings: Secondary | ICD-10-CM

## 2018-01-16 NOTE — Patient Instructions (Signed)
Health Maintenance, Male A healthy lifestyle and preventive care is important for your health and wellness. Ask your health care provider about what schedule of regular examinations is right for you. What should I know about weight and diet? Eat a Healthy Diet  Eat plenty of vegetables, fruits, whole grains, low-fat dairy products, and lean protein.  Do not eat a lot of foods high in solid fats, added sugars, or salt.  Maintain a Healthy Weight Regular exercise can help you achieve or maintain a healthy weight. You should:  Do at least 150 minutes of exercise each week. The exercise should increase your heart rate and make you sweat (moderate-intensity exercise).  Do strength-training exercises at least twice a week.  Watch Your Levels of Cholesterol and Blood Lipids  Have your blood tested for lipids and cholesterol every 5 years starting at 37 years of age. If you are at high risk for heart disease, you should start having your blood tested when you are 37 years old. You may need to have your cholesterol levels checked more often if: ? Your lipid or cholesterol levels are high. ? You are older than 37 years of age. ? You are at high risk for heart disease.  What should I know about cancer screening? Many types of cancers can be detected early and may often be prevented. Lung Cancer  You should be screened every year for lung cancer if: ? You are a current smoker who has smoked for at least 30 years. ? You are a former smoker who has quit within the past 15 years.  Talk to your health care provider about your screening options, when you should start screening, and how often you should be screened.  Colorectal Cancer  Routine colorectal cancer screening usually begins at 37 years of age and should be repeated every 5-10 years until you are 37 years old. You may need to be screened more often if early forms of precancerous polyps or small growths are found. Your health care provider  may recommend screening at an earlier age if you have risk factors for colon cancer.  Your health care provider may recommend using home test kits to check for hidden blood in the stool.  A small camera at the end of a tube can be used to examine your colon (sigmoidoscopy or colonoscopy). This checks for the earliest forms of colorectal cancer.  Prostate and Testicular Cancer  Depending on your age and overall health, your health care provider may do certain tests to screen for prostate and testicular cancer.  Talk to your health care provider about any symptoms or concerns you have about testicular or prostate cancer.  Skin Cancer  Check your skin from head to toe regularly.  Tell your health care provider about any new moles or changes in moles, especially if: ? There is a change in a mole's size, shape, or color. ? You have a mole that is larger than a pencil eraser.  Always use sunscreen. Apply sunscreen liberally and repeat throughout the day.  Protect yourself by wearing long sleeves, pants, a wide-brimmed hat, and sunglasses when outside.  What should I know about heart disease, diabetes, and high blood pressure?  If you are 89-13 years of age, have your blood pressure checked every 3-5 years. If you are 70 years of age or older, have your blood pressure checked every year. You should have your blood pressure measured twice-once when you are at a hospital or clinic, and once when  you are not at a hospital or clinic. Record the average of the two measurements. To check your blood pressure when you are not at a hospital or clinic, you can use: ? An automated blood pressure machine at a pharmacy. ? A home blood pressure monitor.  Talk to your health care provider about your target blood pressure.  If you are between 35-28 years old, ask your health care provider if you should take aspirin to prevent heart disease.  Have regular diabetes screenings by checking your fasting blood  sugar level. ? If you are at a normal weight and have a low risk for diabetes, have this test once every three years after the age of 64. ? If you are overweight and have a high risk for diabetes, consider being tested at a younger age or more often.  A one-time screening for abdominal aortic aneurysm (AAA) by ultrasound is recommended for men aged 71-75 years who are current or former smokers. What should I know about preventing infection? Hepatitis B If you have a higher risk for hepatitis B, you should be screened for this virus. Talk with your health care provider to find out if you are at risk for hepatitis B infection. Hepatitis C Blood testing is recommended for:  Everyone born from [birth year] through 1965.  Anyone with known risk factors for hepatitis C.  Sexually Transmitted Diseases (STDs)  You should be screened each year for STDs including gonorrhea and chlamydia if: ? You are sexually active and are younger than 37 years of age. ? You are older than 37 years of age and your health care provider tells you that you are at risk for this type of infection. ? Your sexual activity has changed since you were last screened and you are at an increased risk for chlamydia or gonorrhea. Ask your health care provider if you are at risk.  Talk with your health care provider about whether you are at high risk of being infected with HIV. Your health care provider may recommend a prescription medicine to help prevent HIV infection.  What else can I do?  Schedule regular health, dental, and eye exams.  Stay current with your vaccines (immunizations).  Do not use any tobacco products, such as cigarettes, chewing tobacco, and e-cigarettes. If you need help quitting, ask your health care provider.  Limit alcohol intake to no more than 2 drinks per day. One drink equals 12 ounces of beer, 5 ounces of wine, or 1 ounces of hard liquor.  Do not use street drugs.  Do not share needles.  Ask your  health care provider for help if you need support or information about quitting drugs.  Tell your health care provider if you often feel depressed.  Tell your health care provider if you have ever been abused or do not feel safe at home. This information is not intended to replace advice given to you by your health care provider. Make sure you discuss any questions you have with your health care provider. Document Released: 01/12/2008 Document Revised: 03/14/2016 Document Reviewed: 04/19/2015 Elsevier Interactive Patient Education  2018 [place] 18-39 Years, Male Preventive care refers to lifestyle choices and visits with your health care provider that can promote health and wellness. What does preventive care include?  A yearly physical exam. This is also called an annual well check.  Dental exams once or twice a year.  Routine eye exams. Ask your health care provider how often you should have  your eyes checked.  Personal lifestyle choices, including: ? Daily care of your teeth and gums. ? Regular physical activity. ? Eating a healthy diet. ? Avoiding tobacco and drug use. ? Limiting alcohol use. ? Practicing safe sex. What happens during an annual well check? The services and screenings done by your health care provider during your annual well check will depend on your age, overall health, lifestyle risk factors, and family history of disease. Counseling Your health care provider may ask you questions about your:  Alcohol use.  Tobacco use.  Drug use.  Emotional well-being.  Home and relationship well-being.  Sexual activity.  Eating habits.  Work and work Statistician.  Screening You may have the following tests or measurements:  Height, weight, and BMI.  Blood pressure.  Lipid and cholesterol levels. These may be checked every 5 years starting at age 12.  Diabetes screening. This is done by checking your blood sugar (glucose) after you  have not eaten for a while (fasting).  Skin check.  Hepatitis C blood test.  Hepatitis B blood test.  Sexually transmitted disease (STD) testing.  Discuss your test results, treatment options, and if necessary, the need for more tests with your health care provider. Vaccines Your health care provider may recommend certain vaccines, such as:  Influenza vaccine. This is recommended every year.  Tetanus, diphtheria, and acellular pertussis (Tdap, Td) vaccine. You may need a Td booster every 10 years.  Varicella vaccine. You may need this if you have not been vaccinated.  HPV vaccine. If you are 64 or younger, you may need three doses over 6 months.  Measles, mumps, and rubella (MMR) vaccine. You may need at least one dose of MMR.You may also need a second dose.  Pneumococcal 13-valent conjugate (PCV13) vaccine. You may need this if you have certain conditions and have not been vaccinated.  Pneumococcal polysaccharide (PPSV23) vaccine. You may need one or two doses if you smoke cigarettes or if you have certain conditions.  Meningococcal vaccine. One dose is recommended if you are age 35-21 years and a first-year college student living in a residence hall, or if you have one of several medical conditions. You may also need additional booster doses.  Hepatitis A vaccine. You may need this if you have certain conditions or if you travel or work in places where you may be exposed to hepatitis A.  Hepatitis B vaccine. You may need this if you have certain conditions or if you travel or work in places where you may be exposed to hepatitis B.  Haemophilus influenzae type b (Hib) vaccine. You may need this if you have certain risk factors.  Talk to your health care provider about which screenings and vaccines you need and how often you need them. This information is not intended to replace advice given to you by your health care provider. Make sure you discuss any questions you have with  your health care provider. Document Released: 09/11/2001 Document Revised: 04/04/2016 Document Reviewed: 05/17/2015 Elsevier Interactive Patient Education  2018 [employer].  Preventing Hypertension Hypertension, commonly called high blood pressure, is when the force of blood pumping through the arteries is too strong. Arteries are blood vessels that carry blood from the heart throughout the body. Over time, hypertension can damage the arteries and decrease blood flow to important parts of the body, including the brain, heart, and kidneys. Often, hypertension does not cause symptoms until blood pressure is very high. For this reason, it is important to have  your blood pressure checked on a regular basis. Hypertension can often be prevented with diet and lifestyle changes. If you already have hypertension, you can control it with diet and lifestyle changes, as well as medicine. What nutrition changes can be made? Maintain a healthy diet. This includes:  Eating less salt (sodium). Ask your health care provider how much sodium is safe for you to have. The general recommendation is to consume less than 1 tsp (2,300 mg) of sodium a day. ? Do not add salt to your food. ? Choose low-sodium options when grocery shopping and eating out.  Limiting fats in your diet. You can do this by eating low-fat or fat-free dairy products and by eating less red meat.  Eating more fruits, vegetables, and whole grains. Make a goal to eat: ? 1-2 cups of fresh fruits and vegetables each day. ? 3-4 servings of whole grains each day.  Avoiding foods and beverages that have added sugars.  Eating fish that contain healthy fats (omega-3 fatty acids), such as mackerel or salmon.  If you need help putting together a healthy eating plan, try the DASH diet. This diet is high in fruits, vegetables, and whole grains. It is low in sodium, red meat, and added sugars. DASH stands for Dietary Approaches to Stop Hypertension. What  lifestyle changes can be made?  Lose weight if you are overweight. Losing just 3?5% of your body weight can help prevent or control hypertension. ? For example, if your present weight is 200 lb (91 kg), a loss of 3-5% of your weight means losing 6-10 lb (2.7-4.5 kg). ? Ask your health care provider to help you with a diet and exercise plan to safely lose weight.  Get enough exercise. Do at least 150 minutes of moderate-intensity exercise each week. ? You could do this in short exercise sessions several times a day, or you could do longer exercise sessions a few times a week. For example, you could take a brisk 10-minute walk or bike ride, 3 times a day, for 5 days a week.  Find ways to reduce stress, such as exercising, meditating, listening to music, or taking a yoga class. If you need help reducing stress, ask your health care provider.  Do not smoke. This includes e-cigarettes. Chemicals in tobacco and nicotine products raise your blood pressure each time you smoke. If you need help quitting, ask your health care provider.  Avoid alcohol. If you drink alcohol, limit alcohol intake to no more than 1 drink a day for nonpregnant women and 2 drinks a day for men. One drink equals 12 oz of beer, 5 oz of wine, or 1 oz of hard liquor. Why are these changes important? Diet and lifestyle changes can help you prevent hypertension, and they may make you feel better overall and improve your quality of life. If you have hypertension, making these changes will help you control it and help prevent major complications, such as:  Hardening and narrowing of arteries that supply blood to: ? Your heart. This can cause a heart attack. ? Your brain. This can cause a stroke. ? Your kidneys. This can cause kidney failure.  Stress on your heart muscle, which can cause heart failure.  What can I do to lower my risk?  Work with your health care provider to make a hypertension prevention plan that works for you.  Follow your plan and keep all follow-up visits as told by your health care provider.  Learn how to check your blood  pressure at home. Make sure that you know your personal target blood pressure, as told by your health care provider. How is this treated? In addition to diet and lifestyle changes, your health care provider may recommend medicines to help lower your blood pressure. You may need to try a few different medicines to find what works best for you. You also may need to take more than one medicine. Take over-the-counter and prescription medicines only as told by your health care provider. Where to find support: Your health care provider can help you prevent hypertension and help you keep your blood pressure at a healthy level. Your local hospital or your community may also provide support services and prevention programs. The American Heart Association offers an online support network at: CheapBootlegs.com.cy Where to find more information: Learn more about hypertension from:  National Heart, Lung, and Blood Institute: ElectronicHangman.is  Centers for Disease Control and Prevention: https://ingram.com/  American Academy of Family Physicians: http://familydoctor.org/familydoctor/en/diseases-conditions/high-blood-pressure.printerview.all.html  Learn more about the DASH diet from:  [place], Lung, and [place]: https://www.reyes.com/  Contact a health care provider if:  You think you are having a reaction to medicines you have taken.  You have recurrent headaches or feel dizzy.  You have swelling in your ankles.  You have trouble with your vision. Summary  Hypertension often does not cause any symptoms until blood pressure is very high. It is important to get your blood pressure checked regularly.  Diet and lifestyle changes are the most important steps in preventing  hypertension.  By keeping your blood pressure in a healthy range, you can prevent complications like heart attack, heart failure, stroke, and kidney failure.  Work with your health care provider to make a hypertension prevention plan that works for you. This information is not intended to replace advice given to you by your health care provider. Make sure you discuss any questions you have with your health care provider. Document Released: 07/31/2015 Document Revised: 03/26/2016 Document Reviewed: 03/26/2016 Elsevier Interactive Patient Education  2018 [place] Eating Plan DASH stands for "Dietary Approaches to Stop Hypertension." The DASH eating plan is a healthy eating plan that has been shown to reduce high blood pressure (hypertension). It may also reduce your risk for type 2 diabetes, heart disease, and stroke. The DASH eating plan may also help with weight loss. What are tips for following this plan? General guidelines  Avoid eating more than 2,300 mg (milligrams) of salt (sodium) a day. If you have hypertension, you may need to reduce your sodium intake to 1,500 mg a day.  Limit alcohol intake to no more than 1 drink a day for nonpregnant women and 2 drinks a day for men. One drink equals 12 oz of beer, 5 oz of wine, or 1 oz of hard liquor.  Work with your health care provider to maintain a healthy body weight or to lose weight. Ask what an ideal weight is for you.  Get at least 30 minutes of exercise that causes your heart to beat faster (aerobic exercise) most days of the week. Activities may include walking, swimming, or biking.  Work with your health care provider or diet and nutrition specialist (dietitian) to adjust your eating plan to your individual calorie needs. Reading food labels  Check food labels for the amount of sodium per serving. Choose foods with less than 5 percent of the Daily Value of sodium. Generally, foods with less than 300 mg of sodium per  serving fit into this  eating plan.  To find whole grains, look for the word "whole" as the first word in the ingredient list. Shopping  Buy products labeled as "low-sodium" or "no salt added."  Buy fresh foods. Avoid canned foods and premade or frozen meals. Cooking  Avoid adding salt when cooking. Use salt-free seasonings or herbs instead of table salt or sea salt. Check with your health care provider or pharmacist before using salt substitutes.  Do not fry foods. Cook foods using healthy methods such as baking, boiling, grilling, and broiling instead.  Cook with heart-healthy oils, such as olive, canola, soybean, or sunflower oil. Meal planning   Eat a balanced diet that includes: ? 5 or more servings of fruits and vegetables each day. At each meal, try to fill half of your plate with fruits and vegetables. ? Up to 6-8 servings of whole grains each day. ? Less than 6 oz of lean meat, poultry, or fish each day. A 3-oz serving of meat is about the same size as a deck of cards. One egg equals 1 oz. ? 2 servings of low-fat dairy each day. ? A serving of nuts, seeds, or beans 5 times each week. ? Heart-healthy fats. Healthy fats called Omega-3 fatty acids are found in foods such as flaxseeds and coldwater fish, like sardines, salmon, and mackerel.  Limit how much you eat of the following: ? Canned or prepackaged foods. ? Food that is high in trans fat, such as fried foods. ? Food that is high in saturated fat, such as fatty meat. ? Sweets, desserts, sugary drinks, and other foods with added sugar. ? Full-fat dairy products.  Do not salt foods before eating.  Try to eat at least 2 vegetarian meals each week.  Eat more home-cooked food and less restaurant, buffet, and fast food.  When eating at a restaurant, ask that your food be prepared with less salt or no salt, if possible. What foods are recommended? The items listed may not be a complete list. Talk with your dietitian about  what dietary choices are best for you. Grains Whole-grain or whole-wheat bread. Whole-grain or whole-wheat pasta. Brown rice. [place]. Bulgur. Whole-grain and low-sodium cereals. Pita bread. Low-fat, low-sodium crackers. Whole-wheat flour tortillas. Vegetables Fresh or frozen vegetables (raw, steamed, roasted, or grilled). Low-sodium or reduced-sodium tomato and vegetable juice. Low-sodium or reduced-sodium tomato sauce and tomato paste. Low-sodium or reduced-sodium canned vegetables. Fruits All fresh, dried, or frozen fruit. Canned fruit in natural juice (without added sugar). Meat and other protein foods Skinless chicken or [place]. Ground chicken or [place]. Pork with fat trimmed off. Fish and seafood. Egg whites. Dried beans, peas, or lentils. Unsalted nuts, nut butters, and seeds. Unsalted canned beans. Lean cuts of beef with fat trimmed off. Low-sodium, lean deli meat. Dairy Low-fat (1%) or fat-free (skim) milk. Fat-free, low-fat, or reduced-fat cheeses. Nonfat, low-sodium ricotta or cottage cheese. Low-fat or nonfat yogurt. Low-fat, low-sodium cheese. Fats and oils Soft margarine without trans fats. Vegetable oil. Low-fat, reduced-fat, or light mayonnaise and salad dressings (reduced-sodium). Canola, safflower, olive, soybean, and sunflower oils. Avocado. Seasoning and other foods Herbs. Spices. Seasoning mixes without salt. Unsalted popcorn and pretzels. Fat-free sweets. What foods are not recommended? The items listed may not be a complete list. Talk with your dietitian about what dietary choices are best for you. Grains Baked goods made with fat, such as croissants, muffins, or some breads. Dry pasta or rice meal packs. Vegetables Creamed or fried vegetables. Vegetables in a cheese sauce. Regular  canned vegetables (not low-sodium or reduced-sodium). Regular canned tomato sauce and paste (not low-sodium or reduced-sodium). Regular tomato and vegetable juice (not low-sodium or  reduced-sodium). Angie Fava. Olives. Fruits Canned fruit in a light or heavy syrup. Fried fruit. Fruit in cream or butter sauce. Meat and other protein foods Fatty cuts of meat. Ribs. Fried meat. Berniece Salines. Sausage. Bologna and other processed lunch meats. Salami. Fatback. Hotdogs. Bratwurst. Salted nuts and seeds. Canned beans with added salt. Canned or smoked fish. Whole eggs or egg yolks. Chicken or [place] with skin. Dairy Whole or 2% milk, cream, and half-and-half. Whole or full-fat cream cheese. Whole-fat or sweetened yogurt. Full-fat cheese. Nondairy creamers. Whipped toppings. Processed cheese and cheese spreads. Fats and oils Butter. Stick margarine. Lard. Shortening. Ghee. Bacon fat. Tropical oils, such as coconut, palm kernel, or palm oil. Seasoning and other foods Salted popcorn and pretzels. Onion salt, garlic salt, seasoned salt, table salt, and sea salt. Worcestershire sauce. Tartar sauce. Barbecue sauce. Teriyaki sauce. Soy sauce, including reduced-sodium. Steak sauce. Canned and packaged gravies. Fish sauce. Oyster sauce. Cocktail sauce. Horseradish that you find on the shelf. Ketchup. Mustard. Meat flavorings and tenderizers. Bouillon cubes. Hot sauce and Tabasco sauce. Premade or packaged marinades. Premade or packaged taco seasonings. Relishes. Regular salad dressings. Where to find more information:  National Heart, Lung, and [place]: https://wilson-eaton.com/  American Heart Association: www.heart.org Summary  The DASH eating plan is a healthy eating plan that has been shown to reduce high blood pressure (hypertension). It may also reduce your risk for type 2 diabetes, heart disease, and stroke.  With the DASH eating plan, you should limit salt (sodium) intake to 2,300 mg a day. If you have hypertension, you may need to reduce your sodium intake to 1,500 mg a day.  When on the DASH eating plan, aim to eat more fresh fruits and vegetables, whole grains, lean proteins, low-fat  dairy, and heart-healthy fats.  Work with your health care provider or diet and nutrition specialist (dietitian) to adjust your eating plan to your individual calorie needs. This information is not intended to replace advice given to you by your health care provider. Make sure you discuss any questions you have with your health care provider. Document Released: 07/05/2011 Document Revised: 07/09/2016 Document Reviewed: 07/09/2016 Elsevier Interactive Patient Education  [employer].

## 2018-01-16 NOTE — Progress Notes (Signed)
Subjective:  Juan Rodriguez is a 37 y.o. male who presents for basic physical exam.  Patient is here for health assessment as required by [facility] to keep his health insurance premium low.  Patient denies any current health related concerns today.  She denies any past medical history of heart disease, lung disease, kidney disease, liver disease, asthma, HTN or DM.  The patient states he does not take any medications on a daily basis, and has no allergies to medications.  The patient states it has been a while since he has been to the doctor, but he did have a physical last year.  The patient and his wife are sexually active, and the patient states his wife has had a tubal ligation.  The patient has a surgical history of wisdom tooth extraction, and Lasix eye surgery..   She is married and has 2 children.  The patient states his children are healthy.  The patient has a family medical history of Crohn's disease on his mother side, and arthritis on his father side.  The patient has 1 brother who has a heart condition, the patient did not elaborate on the type of heart condition it was.  The patient denies the use of recreational drugs, smoking, and states that he drinks very seldomly.    There is no immunization history on file for this patient.  Past Medical History:  Diagnosis Date  . Diverticulitis     Past Surgical History:  Procedure Laterality Date  . EYE SURGERY     Lasix  . WISDOM TOOTH EXTRACTION      Social History   Tobacco Use  . Smoking status: Never Smoker  . Smokeless tobacco: Never Used  Substance Use Topics  . Alcohol use: Yes    Comment: occasional  . Drug use: No    Review of Systems  Constitutional: Negative.   HENT: Negative.   Eyes: Negative.   Respiratory: Negative.   Cardiovascular: Negative.   Gastrointestinal: Negative.   Genitourinary: Negative.   Musculoskeletal: Negative.   Skin: Negative.   Neurological: Negative.   Endo/Heme/Allergies:  Negative.   Psychiatric/Behavioral: Negative.      Objective:  BP 138/88 (BP Location: Right Arm, Patient Position: Sitting, Cuff Size: Normal)   Pulse 97   Temp 98.7 F (37.1 C) (Oral)   Resp 18   Wt 236 lb 6.4 oz (107.2 kg)   SpO2 97%   BMI 35.94 kg/m   General Appearance:  Alert, cooperative, no distress, appears stated age  Head:  Normocephalic, without obvious abnormality, atraumatic  Eyes:  PERRL, conjunctiva/corneas clear, EOM's intact, fundi benign, both eyes  Ears:  Normal TM's and external ear canals, both ears  Nose: Nares normal, septum midline, mucosa normal, no drainage or sinus tenderness  Throat: Lips, mucosa, and tongue normal; teeth and gums normal  Neck: Supple, symmetrical, trachea midline, no adenopathy, thyroid: not enlarged, symmetric, no tenderness/mass/nodules, no carotid bruit or JVD  Back:   Symmetric, no curvature, ROM normal, no CVA tenderness  Lungs:   Clear to auscultation bilaterally, respirations unlabored  Chest Wall:  No tenderness or deformity  Heart:  Regular rate and rhythm, S1, S2 normal, no murmur, rub or gallop  Abdomen:   Soft, non-tender, bowel sounds active all four quadrants,  no masses, no organomegaly  Genitalia:  Normal male  Rectal:  Normal tone, normal prostate, no masses or tenderness; guaiac negative stool  Extremities: Extremities normal, atraumatic, no cyanosis or edema  Pulses: 2+ and symmetric  Skin: Skin color, texture, turgor normal, no rashes or lesions  Lymph nodes: Cervical, supraclavicular, and axillary nodes normal  Neurologic: Normal        Assessment:  basic physical exam    Plan:  Patient education provided.  The patient states he does not have a PCP at this time, so the number for the patient engagement center was provided to the patient.  Patient was instructed that once he establishes care with his with a PCP, he would be able to have further screening test and lab work.  Discussed with patient his  blood pressure during today's visit.  The patient was given information for preventing hypertension, the DASH diet, health maintenance, and health prevention for his age group.  The patient verbalizes understanding and has no questions at time of discharge.

## 2018-09-05 ENCOUNTER — Encounter: Payer: Self-pay | Admitting: Emergency Medicine

## 2018-09-05 ENCOUNTER — Ambulatory Visit
Admission: EM | Admit: 2018-09-05 | Discharge: 2018-09-05 | Disposition: A | Discharge status: Home / Self Care | Payer: 59 | Attending: Family Medicine | Admitting: Family Medicine

## 2018-09-05 DIAGNOSIS — K644 Residual hemorrhoidal skin tags: Secondary | ICD-10-CM

## 2018-09-05 MED ORDER — HYDROCORTISONE 2.5 % RE CREA: 1.0000 "application " | TOPICAL_CREAM | Freq: Two times a day (BID) | RECTAL | 0 refills | Status: DC

## 2018-09-05 NOTE — Discharge Instructions (Signed)
You had a large hemorroid Please apply anusol hc cream twice daily to hemorroid/rectum Soak in warm water for 20-30 min twice daily May try sitting on a warm tea bag  Avoid straining, may consider taking colace or miralax to help soften stools to help with discomfort  Follow up with [facility] [place] [facility] if symptoms persisting and not resolving as it may need to be removed

## 2018-09-05 NOTE — ED Provider Notes (Signed)
[name]    CSN: [id number] Arrival date & time: 09/05/18  1318     History   Chief Complaint Chief Complaint  Patient presents with  . Rectal Bleeding    HPI Juan Rodriguez is a 38 y.o. male  no contributing past medical history presenting today for evaluation of rectal pain and bleeding.  Patient states that over the past week he has had some discomfort rectally, over the past 3 to 4 days he has noticed some bleeding mainly with wiping.  He denies blood mixed in the stool.  He denies history of hemorrhoids, but believes he may have a hemorrhoid.  He denies any other drainage from his rectum.  Pain increases with bowel movements as well as sitting.  He has not done anything for his symptoms.  Is also noticed some left side discomfort that goes into his left leg.  He denies any injury or increase in activity.  He denies history of constipation, states typical bowel movements are every other day, but recently has been going twice daily.  Denies any increase in straining or heavy lifting.  HPI  Past Medical History:  Diagnosis Date  . Diverticulitis     Patient Active Problem List   Diagnosis Date Noted  . Diarrhea 01/14/2015  . LLQ pain 01/05/2015    Past Surgical History:  Procedure Laterality Date  . EYE SURGERY     Lasix  . WISDOM TOOTH EXTRACTION         Home Medications    Prior to Admission medications   Medication Sig Start Date End Date Taking? Authorizing Provider  triamcinolone cream (KENALOG) 0.1 % Apply 1 application topically 2 (two) times daily.   Yes [provider]  hydrocortisone (ANUSOL-HC) 2.5 % rectal cream Place 1 application rectally 2 (two) times daily. 09/05/18   Indiyah Paone, Elesa Hacker, PA-C    Family History Family History  Problem Relation Age of Onset  . Crohn's disease Mother   . Healthy Father   . Alzheimer's disease Maternal Grandmother   . Healthy Paternal Grandmother     Social History Social History    Tobacco Use  . Smoking status: Never Smoker  . Smokeless tobacco: Never Used  Substance Use Topics  . Alcohol use: Yes    Comment: occasional  . Drug use: No     Allergies   Patient has no known allergies.   Review of Systems Review of Systems  Constitutional: Negative for activity change, appetite change, chills, fatigue and fever.  HENT: Negative for congestion, ear pain, rhinorrhea, sinus pressure, sore throat and trouble swallowing.   Eyes: Negative for discharge and redness.  Respiratory: Negative for cough, chest tightness and shortness of breath.   Cardiovascular: Negative for chest pain.  Gastrointestinal: Positive for anal bleeding, blood in stool and rectal pain. Negative for abdominal pain, diarrhea, nausea and vomiting.  Musculoskeletal: Negative for myalgias.  Skin: Negative for rash.  Neurological: Negative for dizziness, light-headedness and headaches.     Physical Exam Triage Vital Signs ED Triage Vitals [09/05/18 1331]  Enc Vitals Group     BP 133/87     Pulse Rate 82     Resp 16     Temp 97.6 F (36.4 C)     Temp Source Oral     SpO2 95 %     Weight      Height      Head Circumference      Peak Flow  Pain Score 4     Pain Loc      Pain Edu?      Excl. in [place]?    No data found.  Updated Vital Signs BP 133/87   Pulse 82   Temp 97.6 F (36.4 C) (Oral)   Resp 16   SpO2 95%   Visual Acuity Right Eye Distance:   Left Eye Distance:   Bilateral Distance:    Right Eye Near:   Left Eye Near:    Bilateral Near:     Physical Exam Vitals signs and nursing note reviewed.  Constitutional:      Appearance: He is well-developed.  HENT:     Head: Normocephalic and atraumatic.  Eyes:     Conjunctiva/sclera: Conjunctivae normal.  Neck:     Musculoskeletal: Neck supple.  Cardiovascular:     Rate and Rhythm: Normal rate and regular rhythm.     Heart sounds: No murmur.  Pulmonary:     Effort: Pulmonary effort is normal. No respiratory  distress.     Breath sounds: Normal breath sounds.  Abdominal:     Palpations: Abdomen is soft.     Tenderness: There is no abdominal tenderness.     Comments: Nontender to light and deep palpation throughout abdomen  Genitourinary:    Comments: Large approximately 2 cm nonthrombosed hemorrhoid to right side of rectum extending from approximately 1:00 to 4:00  No masses palpated internally  No induration or erythema, no signs of perirectal abscess Musculoskeletal:     Comments: Full active range of motion at left hip and knee  Skin:    General: Skin is warm and dry.  Neurological:     Mental Status: He is alert.      UC Treatments / Results  Labs (all labs ordered are listed, but only abnormal results are displayed) Labs Reviewed - No data to display  EKG None  Radiology No results found.  Procedures Procedures (including critical care time)  Medications Ordered in UC Medications - No data to display  Initial Impression / Assessment and Plan / UC Course  I have reviewed the triage vital signs and the nursing notes.  Pertinent labs & imaging results that were available during my care of the patient were reviewed by me and considered in my medical decision making (see chart for details).    Patient with large nonthrombosed hemorrhoid likely causing bleeding and pain.  Will treat with Anusol HC cream, sitz bath's, further symptomatic recommendations in terms of losing stool, avoiding straining.  Provided information for [facility] [place] [facility] in case symptoms not resolving unknown given size of hemorrhoid.  We will continue to monitor the left side pain, Tylenol and ibuprofen as needed.Discussed strict return precautions. Patient verbalized understanding and is agreeable with plan.   Final Clinical Impressions(s) / UC Diagnoses   Final diagnoses:  External hemorrhoids without complication     Discharge Instructions     You had a large hemorroid Please apply  anusol hc cream twice daily to hemorroid/rectum Soak in warm water for 20-30 min twice daily May try sitting on a warm tea bag  Avoid straining, may consider taking colace or miralax to help soften stools to help with discomfort  Follow up with [facility] [place] [facility] if symptoms persisting and not resolving as it may need to be removed     ED Prescriptions    Medication Sig Dispense Auth. Provider   hydrocortisone (ANUSOL-HC) 2.5 % rectal cream Place 1 application rectally 2 (two)  times daily. 30 g Baelyn Doring, [place] C, PA-C     Controlled Substance Prescriptions [place] Controlled Substance Registry consulted? Not Applicable   Janith Lima, [place] 09/05/18 1411

## 2018-09-05 NOTE — ED Triage Notes (Signed)
Pt presents to [facility] for assessment of rectal pain x 8 days.  States he has been noting blood on the tissue when he wipes after a bowel movement.  Pt also c/o left leg throbbing.  Both of these are keeping him from sleeping.

## 2018-12-26 DIAGNOSIS — M9901 Segmental and somatic dysfunction of cervical region: Secondary | ICD-10-CM | POA: Diagnosis not present

## 2018-12-26 DIAGNOSIS — M9903 Segmental and somatic dysfunction of lumbar region: Secondary | ICD-10-CM | POA: Diagnosis not present

## 2018-12-26 DIAGNOSIS — M9902 Segmental and somatic dysfunction of thoracic region: Secondary | ICD-10-CM | POA: Diagnosis not present

## 2018-12-26 DIAGNOSIS — I10 Essential (primary) hypertension: Secondary | ICD-10-CM | POA: Diagnosis not present

## 2018-12-26 DIAGNOSIS — M545 Low back pain: Secondary | ICD-10-CM | POA: Diagnosis not present

## 2018-12-31 DIAGNOSIS — M9901 Segmental and somatic dysfunction of cervical region: Secondary | ICD-10-CM | POA: Diagnosis not present

## 2018-12-31 DIAGNOSIS — M9903 Segmental and somatic dysfunction of lumbar region: Secondary | ICD-10-CM | POA: Diagnosis not present

## 2018-12-31 DIAGNOSIS — M545 Low back pain: Secondary | ICD-10-CM | POA: Diagnosis not present

## 2018-12-31 DIAGNOSIS — M9902 Segmental and somatic dysfunction of thoracic region: Secondary | ICD-10-CM | POA: Diagnosis not present

## 2019-01-09 DIAGNOSIS — M9902 Segmental and somatic dysfunction of thoracic region: Secondary | ICD-10-CM | POA: Diagnosis not present

## 2019-01-09 DIAGNOSIS — M545 Low back pain: Secondary | ICD-10-CM | POA: Diagnosis not present

## 2019-01-09 DIAGNOSIS — M9901 Segmental and somatic dysfunction of cervical region: Secondary | ICD-10-CM | POA: Diagnosis not present

## 2019-01-09 DIAGNOSIS — M9903 Segmental and somatic dysfunction of lumbar region: Secondary | ICD-10-CM | POA: Diagnosis not present

## 2019-01-16 DIAGNOSIS — M9903 Segmental and somatic dysfunction of lumbar region: Secondary | ICD-10-CM | POA: Diagnosis not present

## 2019-01-16 DIAGNOSIS — M9902 Segmental and somatic dysfunction of thoracic region: Secondary | ICD-10-CM | POA: Diagnosis not present

## 2019-01-16 DIAGNOSIS — M545 Low back pain: Secondary | ICD-10-CM | POA: Diagnosis not present

## 2019-01-16 DIAGNOSIS — M9901 Segmental and somatic dysfunction of cervical region: Secondary | ICD-10-CM | POA: Diagnosis not present

## 2019-05-21 MED FILL — FLUARIX QUADRIVALENT 0.5 ML: 0.5 | 1 days supply | Qty: 1 | Fill #0

## 2019-06-22 ENCOUNTER — Other Ambulatory Visit: Payer: Self-pay

## 2019-06-22 ENCOUNTER — Ambulatory Visit
Admission: RE | Admit: 2019-06-22 | Discharge: 2019-06-22 | Disposition: A | Discharge status: Home / Self Care | Payer: 59 | Source: Ambulatory Visit | Attending: Family Medicine | Admitting: Family Medicine

## 2019-06-22 ENCOUNTER — Ambulatory Visit (INDEPENDENT_AMBULATORY_CARE_PROVIDER_SITE_OTHER): Payer: 59 | Admitting: Family Medicine

## 2019-06-22 VITALS — BP 124/92 | Ht 68.0 in | Wt 225.0 lb

## 2019-06-22 DIAGNOSIS — S62324A Displaced fracture of shaft of fourth metacarpal bone, right hand, initial encounter for closed fracture: Secondary | ICD-10-CM | POA: Diagnosis not present

## 2019-06-22 DIAGNOSIS — M79641 Pain in right hand: Secondary | ICD-10-CM | POA: Diagnosis not present

## 2019-06-22 DIAGNOSIS — S62354A Nondisplaced fracture of shaft of fourth metacarpal bone, right hand, initial encounter for closed fracture: Secondary | ICD-10-CM

## 2019-06-22 DIAGNOSIS — S62308A Unspecified fracture of other metacarpal bone, initial encounter for closed fracture: Secondary | ICD-10-CM

## 2019-06-22 HISTORY — DX: Unspecified fracture of other metacarpal bone, initial encounter for closed fracture: S62.308A

## 2019-06-22 NOTE — Assessment & Plan Note (Signed)
Right-sided, acute after fall yesterday. Clinical concern for fourth metacarpal fracture with swelling/tenderness at this region and associated fourth digit weakness due to pain. XRs obtained confirming a slightly displaced spiral fourth metacarpal shaft fracture.  Placed into an ulnar gutter splint and will have him follow-up in the clinic in approximately 2 weeks with repeat x-rays obtained prior to visit.  Discussed expectations of likely 4-6 weeks to allow recovery.

## 2019-06-22 NOTE — Progress Notes (Signed)
  Juan Rodriguez - 38 y.o. male MRN [id number]  Date of birth: 1981/06/23  SUBJECTIVE:   CC: Right hand pain  HPI: Mr. Juan Rodriguez is an otherwise healthy 38 year old gentleman presenting for evaluation of acute right hand pain s/p fall.  Right hand pain: Fell on outstretched hand and his right knee after missing his balance getting up on a step into the bed of his truck.  Had immediate sharp lateral right hand pain following the fall with dorsal swelling, however continue to work.  He was hoping his hand will get better overnight, however today was unable to grip well due the pain with persistent swelling so sought out care.  Notes weakness in moving his fourth digit.  Fortunately left-handed.  He iced his hand a few times last night and took some Advil with some relief.  Denies any distal numbness/tingling.  States his right knee is fine and that his hand took the majority of his weight, denies any knee pain, swelling, locking, giving out sensation, abrasions.  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  Past medical, surgical, family, and social history reviewed.  Medications reviewed.  PHYSICAL EXAM:  VS: BP:(!) 124/92  HR: bpm  TEMP: ( )  RESP:   HT:5\' 8"  (172.7 cm)   WT:225 lb (102.1 kg)  BMI:34.22 PHYSICAL EXAM: Gen: NAD, alert, cooperative with exam, well-appearing Resp: non-labored Skin: no rashes, normal turgor  Neuro: no gross deficits.  Psych:  alert and oriented  Right hand/fingers: Inspection: Notable swelling on dorsal lateral hand around fourth/fifth MCP.  No overlying erythema or bruising.  No instability. Palpation: Pinpoint tenderness along fourth metacarpal on dorsal aspect.  Otherwise nontender with palpation of wrist, anatomical snuffbox, or other metacarpal/carpal bones. ROM: Full ROM of the digits and wrist with the exception of mildly reduced wrist extension. Fully able to extend and flex fingers. Strength: 5/5 strength within wrist and fingers with the exception of weakened fourth  digit flexion/extension/abduction with pain elicited on resisted motion. Neurovascular: NV intact  Left hand: Inspection: No obvious deformity. No swelling, erythema or bruising, instability Palpation: no TTP ROM: Full ROM of the digits and wrist. Fully able to extend and flex finger. Strength: 5/5 strength in the forearm, wrist and interosseus muscles Neurovascular: NV intact  ASSESSMENT & PLAN:   Closed fracture of 4th metacarpal Right-sided, acute after fall yesterday. Clinical concern for fourth metacarpal fracture with swelling/tenderness at this region and associated fourth digit weakness due to pain. XRs obtained confirming a slightly displaced spiral fourth metacarpal shaft fracture.  Placed into an ulnar gutter splint and will have him follow-up in the clinic in approximately 2 weeks with repeat x-rays obtained prior to visit.  Discussed expectations of likely 4-6 weeks to allow recovery.    Follow-up in 2 weeks for above or sooner if needed.  Patriciaann Clan, DO  Family Medicine PGY-2

## 2019-06-22 NOTE — Patient Instructions (Signed)
You have a fracture of your 4th metacarpal. Wear the splint at all times. Elevate above your heart as needed for swelling, bruising. Tylenol, aleve as needed for pain. Expect this to take 6 total weeks to recover. Follow up with me in 2 weeks - get x-rays before your visit (make sure you take the splint off before you get these).

## 2019-06-23 ENCOUNTER — Encounter: Payer: Self-pay | Admitting: Family Medicine

## 2019-07-08 ENCOUNTER — Other Ambulatory Visit: Payer: Self-pay

## 2019-07-08 ENCOUNTER — Encounter: Payer: Self-pay | Admitting: Family Medicine

## 2019-07-08 ENCOUNTER — Ambulatory Visit: Payer: 59 | Admitting: Family Medicine

## 2019-07-08 ENCOUNTER — Ambulatory Visit
Admission: RE | Admit: 2019-07-08 | Discharge: 2019-07-08 | Disposition: A | Discharge status: Home / Self Care | Payer: 59 | Source: Ambulatory Visit | Attending: Family Medicine | Admitting: Family Medicine

## 2019-07-08 VITALS — BP 130/82 | Ht 68.0 in | Wt 225.0 lb

## 2019-07-08 DIAGNOSIS — M79641 Pain in right hand: Secondary | ICD-10-CM

## 2019-07-08 DIAGNOSIS — S62394D Other fracture of fourth metacarpal bone, right hand, subsequent encounter for fracture with routine healing: Secondary | ICD-10-CM | POA: Diagnosis not present

## 2019-07-09 ENCOUNTER — Encounter: Payer: Self-pay | Admitting: Family Medicine

## 2019-07-09 NOTE — Progress Notes (Signed)
PCP: Patient, No Pcp Per  Subjective:   HPI: Patient is a 38 y.o. male here for right hand injury.  11/23: Right hand pain: Fell on outstretched hand and his right knee after missing his balance getting up on a step into the bed of his truck.  Had immediate sharp lateral right hand pain following the fall with dorsal swelling, however continue to work.  He was hoping his hand will get better overnight, however today was unable to grip well due the pain with persistent swelling so sought out care.  Notes weakness in moving his fourth digit.  Fortunately left-handed.  He iced his hand a few times last night and took some Advil with some relief.  Denies any distal numbness/tingling.  States his right knee is fine and that his hand took the majority of his weight, denies any knee pain, swelling, locking, giving out sensation, abrasions.  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 Tolerating splint and wearing regularly. Swelling has improved. Pain is minimal and more of a stiffness now.  Past Medical History:  Diagnosis Date  . Diverticulitis     Current Outpatient Medications on File Prior to Visit  Medication Sig Dispense Refill  . hydrocortisone (ANUSOL-HC) 2.5 % rectal cream Place 1 application rectally 2 (two) times daily. 30 g 0  . triamcinolone cream (KENALOG) 0.1 % Apply 1 application topically 2 (two) times daily.     No current facility-administered medications on file prior to visit.    Past Surgical History:  Procedure Laterality Date  . EYE SURGERY     Lasix  . WISDOM TOOTH EXTRACTION      No Known Allergies  Social History   Socioeconomic History  . Marital status: Married    Spouse name: Not on file  . Number of children: 2  . Years of education: 39  . Highest education level: Not on file  Occupational History  . Occupation: Glass blower/designer  Tobacco Use  . Smoking status: Never Smoker  . Smokeless tobacco: Never Used   Substance and Sexual Activity  . Alcohol use: Yes    Comment: occasional  . Drug use: No  . Sexual activity: Yes  Other Topics Concern  . Not on file  Social History Narrative   Fun: Whatever his kids want to do.   Kids ages 9 & 79   Denies religious beliefs effecting health care.    Social Determinants of Health   Financial Resource Strain:   . Difficulty of Paying Living Expenses: Not on file  Food Insecurity:   . Worried About Charity fundraiser in the Last Year: Not on file  . Ran Out of Food in the Last Year: Not on file  Transportation Needs:   . Lack of Transportation (Medical): Not on file  . Lack of Transportation (Non-Medical): Not on file  Physical Activity:   . Days of Exercise per Week: Not on file  . Minutes of Exercise per Session: Not on file  Stress:   . Feeling of Stress : Not on file  Social Connections:   . Frequency of Communication with Friends and Family: Not on file  . Frequency of Social Gatherings with Friends and Family: Not on file  . Attends Religious Services: Not on file  . Active Member of Clubs or Organizations: Not on file  . Attends Archivist Meetings: Not on file  . Marital Status: Not on file  Intimate Partner Violence:   .  Fear of Current or Ex-Partner: Not on file  . Emotionally Abused: Not on file  . Physically Abused: Not on file  . Sexually Abused: Not on file    Family History  Problem Relation Age of Onset  . Crohn's disease Mother   . Healthy Father   . Alzheimer's disease Maternal Grandmother   . Healthy Paternal Grandmother     BP 130/82   Ht 5\' 8"  (1.727 m)   Wt 225 lb (102.1 kg)   BMI 34.21 kg/m   Review of Systems: See HPI above.     Objective:  Physical Exam:  Gen: NAD, comfortable in exam room  Right hand/wrist: Minimal swelling, no bruising.  Mild loss of 4th knuckle.  No malrotation or angulation compared to left hand. Able to flex and extend at PIP, MCP, DIP of 4th digit and resist  these motions but did not test full strength with known fracture. Minimal tenderness to palpation 4th metacarpal. NVI distally.   Assessment & Plan:  1. Right 4th metacarpal fracture - independently reviewed today's radiographs and no displacement compared to last radiographs.  Clinically he is healing as well.  New ulnar gutter splint placed today - will f/u in 2 weeks after obtaining repeat radiographs.  Hopefully there is some callus formation at that point and we can transition off the splinting to buddy taping for support.  Tylenol, ibuprofen only if needed.

## 2019-07-21 ENCOUNTER — Ambulatory Visit: Payer: 59 | Admitting: Family Medicine

## 2019-07-21 ENCOUNTER — Other Ambulatory Visit: Payer: Self-pay

## 2019-07-21 ENCOUNTER — Encounter: Payer: Self-pay | Admitting: Family Medicine

## 2019-07-21 ENCOUNTER — Ambulatory Visit
Admission: RE | Admit: 2019-07-21 | Discharge: 2019-07-21 | Disposition: A | Discharge status: Home / Self Care | Payer: 59 | Source: Ambulatory Visit | Attending: Family Medicine | Admitting: Family Medicine

## 2019-07-21 VITALS — BP 120/70 | Ht 68.0 in | Wt 225.0 lb

## 2019-07-21 DIAGNOSIS — M79641 Pain in right hand: Secondary | ICD-10-CM

## 2019-07-21 DIAGNOSIS — S62304D Unspecified fracture of fourth metacarpal bone, right hand, subsequent encounter for fracture with routine healing: Secondary | ICD-10-CM | POA: Diagnosis not present

## 2019-07-21 NOTE — Progress Notes (Signed)
PCP: Patient, No Pcp Per  Subjective:   HPI: Patient is a 38 y.o. male here for right hand injury.  11/23: Right hand pain: Fell on outstretched hand and his right knee after missing his balance getting up on a step into the bed of his truck.  Had immediate sharp lateral right hand pain following the fall with dorsal swelling, however continue to work.  He was hoping his hand will get better overnight, however today was unable to grip well due the pain with persistent swelling so sought out care.  Notes weakness in moving his fourth digit.  Fortunately left-handed.  He iced his hand a few times last night and took some Advil with some relief.  Denies any distal numbness/tingling.  States his right knee is fine and that his hand took the majority of his weight, denies any knee pain, swelling, locking, giving out sensation, abrasions.  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 Tolerating splint and wearing regularly. Swelling has improved. Pain is minimal and more of a stiffness now.  12/22: Patient reports no pain now. Has been wearing gutter splint without a problem. No swelling.  Past Medical History:  Diagnosis Date  . Diverticulitis     Current Outpatient Medications on File Prior to Visit  Medication Sig Dispense Refill  . hydrocortisone (ANUSOL-HC) 2.5 % rectal cream Place 1 application rectally 2 (two) times daily. 30 g 0  . triamcinolone cream (KENALOG) 0.1 % Apply 1 application topically 2 (two) times daily.     No current facility-administered medications on file prior to visit.    Past Surgical History:  Procedure Laterality Date  . EYE SURGERY     Lasix  . WISDOM TOOTH EXTRACTION      No Known Allergies  Social History   Socioeconomic History  . Marital status: Married    Spouse name: Not on file  . Number of children: 2  . Years of education: 50  . Highest education level: Not on file  Occupational History  . Occupation:  Glass blower/designer  Tobacco Use  . Smoking status: Never Smoker  . Smokeless tobacco: Never Used  Substance and Sexual Activity  . Alcohol use: Yes    Comment: occasional  . Drug use: No  . Sexual activity: Yes  Other Topics Concern  . Not on file  Social History Narrative   Fun: Whatever his kids want to do.   Kids ages 35 & 33   Denies religious beliefs effecting health care.    Social Determinants of Health   Financial Resource Strain:   . Difficulty of Paying Living Expenses: Not on file  Food Insecurity:   . Worried About Charity fundraiser in the Last Year: Not on file  . Ran Out of Food in the Last Year: Not on file  Transportation Needs:   . Lack of Transportation (Medical): Not on file  . Lack of Transportation (Non-Medical): Not on file  Physical Activity:   . Days of Exercise per Week: Not on file  . Minutes of Exercise per Session: Not on file  Stress:   . Feeling of Stress : Not on file  Social Connections:   . Frequency of Communication with Friends and Family: Not on file  . Frequency of Social Gatherings with Friends and Family: Not on file  . Attends Religious Services: Not on file  . Active Member of Clubs or Organizations: Not on file  . Attends Archivist Meetings:  Not on file  . Marital Status: Not on file  Intimate Partner Violence:   . Fear of Current or Ex-Partner: Not on file  . Emotionally Abused: Not on file  . Physically Abused: Not on file  . Sexually Abused: Not on file    Family History  Problem Relation Age of Onset  . Crohn's disease Mother   . Healthy Father   . Alzheimer's disease Maternal Grandmother   . Healthy Paternal Grandmother     BP 120/70   Ht 5\' 8"  (1.727 m)   Wt 225 lb (102.1 kg)   BMI 34.21 kg/m   Review of Systems: See HPI above.     Objective:  Physical Exam:  Gen: NAD, comfortable in exam room  Right hand/wrist: No deformity, instability. FROM with 5/5 strength digits.  Able to make a  fist. No tenderness to palpation. NVI distally.   Assessment & Plan:  1. Right 4th metacarpal fracture - clinically healed at this point and callus formation noted on radiographs.  Discontinue splinting.  Advised taking care next 2 weeks to avoid direct blow to the area.  Follow up as needed.

## 2019-10-01 DIAGNOSIS — Z23 Encounter for immunization: Secondary | ICD-10-CM | POA: Diagnosis not present

## 2019-10-28 DIAGNOSIS — M9902 Segmental and somatic dysfunction of thoracic region: Secondary | ICD-10-CM | POA: Diagnosis not present

## 2019-10-28 DIAGNOSIS — M542 Cervicalgia: Secondary | ICD-10-CM | POA: Diagnosis not present

## 2019-10-28 DIAGNOSIS — M9901 Segmental and somatic dysfunction of cervical region: Secondary | ICD-10-CM | POA: Diagnosis not present

## 2019-11-23 DIAGNOSIS — Z23 Encounter for immunization: Secondary | ICD-10-CM | POA: Diagnosis not present

## 2019-12-25 DIAGNOSIS — M9902 Segmental and somatic dysfunction of thoracic region: Secondary | ICD-10-CM | POA: Diagnosis not present

## 2019-12-25 DIAGNOSIS — M9901 Segmental and somatic dysfunction of cervical region: Secondary | ICD-10-CM | POA: Diagnosis not present

## 2019-12-25 DIAGNOSIS — M542 Cervicalgia: Secondary | ICD-10-CM | POA: Diagnosis not present

## 2020-01-21 ENCOUNTER — Other Ambulatory Visit: Payer: Self-pay

## 2020-01-21 ENCOUNTER — Ambulatory Visit: Payer: 59 | Admitting: Family Medicine

## 2020-01-21 VITALS — BP 118/70 | HR 70 | Ht 68.0 in | Wt 240.8 lb

## 2020-01-21 DIAGNOSIS — Z0000 Encounter for general adult medical examination without abnormal findings: Secondary | ICD-10-CM | POA: Diagnosis not present

## 2020-01-21 NOTE — Progress Notes (Signed)
    SUBJECTIVE:   CHIEF COMPLAINT / HPI:   Annual Physical Exam Patient states he is here to get an annual physical exam as required per his insurance. He has no complaints and states he feels well. 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 Patient had colonoscopy in 2018 due to Family Hx of Crohn's disease and due to multiple bouts of diverticulitis. He was told his colonoscopy was normal.  PERTINENT  PMH / PSH: Hx of Diverticulitis,  OBJECTIVE:   BP 118/70   Pulse 70   Ht 5\' 8"  (1.727 m)   Wt 240 lb 12.8 oz (109.2 kg)   SpO2 98%   BMI 36.61 kg/m   Gen: NAD HEENT: PERRLA, EOMI Cardiac: RRR, no murmurs Resp: CTAB Abd: soft, non-distended, non-tender, +bs MSK: 5/5 strength in UE and LE bilaterally, gross sensation intact, normal gait Ext: +2 distal pulses bilaterally  ASSESSMENT/PLAN:   Healthy adult on routine physical examination HIV and Hep C screening declined. - Return in one year for annual exam     [place], [place]

## 2020-01-21 NOTE — Patient Instructions (Signed)
It was great to meet you today! Thank you for letting me participate in your care!  Today, we discussed your overall health care and I am glad you are doing well! Come back and see [place] next year for your annual physical exam!  Be well, Harolyn Rutherford, DO PGY-3, [place]

## 2020-01-21 NOTE — Assessment & Plan Note (Signed)
HIV and Hep C screening declined. - Return in one year for annual exam

## 2020-02-22 DIAGNOSIS — M546 Pain in thoracic spine: Secondary | ICD-10-CM | POA: Diagnosis not present

## 2020-02-22 DIAGNOSIS — M9902 Segmental and somatic dysfunction of thoracic region: Secondary | ICD-10-CM | POA: Diagnosis not present

## 2020-02-22 DIAGNOSIS — M9901 Segmental and somatic dysfunction of cervical region: Secondary | ICD-10-CM | POA: Diagnosis not present

## 2020-02-22 DIAGNOSIS — M542 Cervicalgia: Secondary | ICD-10-CM | POA: Diagnosis not present

## 2020-02-22 DIAGNOSIS — M9907 Segmental and somatic dysfunction of upper extremity: Secondary | ICD-10-CM | POA: Diagnosis not present

## 2020-02-22 DIAGNOSIS — M25512 Pain in left shoulder: Secondary | ICD-10-CM | POA: Diagnosis not present

## 2020-02-25 ENCOUNTER — Telehealth: Payer: Self-pay | Admitting: Family Medicine

## 2020-02-25 NOTE — Telephone Encounter (Signed)
Pt.'s wife dropped off forms for General Information / Health History to be completed.  Last appt. 01/21/20 Form placed in white team folder

## 2020-02-26 DIAGNOSIS — M9907 Segmental and somatic dysfunction of upper extremity: Secondary | ICD-10-CM | POA: Diagnosis not present

## 2020-02-26 DIAGNOSIS — M9902 Segmental and somatic dysfunction of thoracic region: Secondary | ICD-10-CM | POA: Diagnosis not present

## 2020-02-26 DIAGNOSIS — M546 Pain in thoracic spine: Secondary | ICD-10-CM | POA: Diagnosis not present

## 2020-02-26 DIAGNOSIS — M542 Cervicalgia: Secondary | ICD-10-CM | POA: Diagnosis not present

## 2020-02-26 DIAGNOSIS — M25512 Pain in left shoulder: Secondary | ICD-10-CM | POA: Diagnosis not present

## 2020-02-26 DIAGNOSIS — M9901 Segmental and somatic dysfunction of cervical region: Secondary | ICD-10-CM | POA: Diagnosis not present

## 2020-02-29 NOTE — Telephone Encounter (Signed)
Form placed in box at front desk to be picked up by pt.  Listed under [employer].

## 2020-05-25 DIAGNOSIS — M546 Pain in thoracic spine: Secondary | ICD-10-CM | POA: Diagnosis not present

## 2020-05-25 DIAGNOSIS — M9902 Segmental and somatic dysfunction of thoracic region: Secondary | ICD-10-CM | POA: Diagnosis not present

## 2020-05-25 DIAGNOSIS — M9903 Segmental and somatic dysfunction of lumbar region: Secondary | ICD-10-CM | POA: Diagnosis not present

## 2020-05-25 DIAGNOSIS — M5413 Radiculopathy, cervicothoracic region: Secondary | ICD-10-CM | POA: Diagnosis not present

## 2020-05-25 DIAGNOSIS — M9901 Segmental and somatic dysfunction of cervical region: Secondary | ICD-10-CM | POA: Diagnosis not present

## 2020-06-03 DIAGNOSIS — M5413 Radiculopathy, cervicothoracic region: Secondary | ICD-10-CM | POA: Diagnosis not present

## 2020-06-03 DIAGNOSIS — M546 Pain in thoracic spine: Secondary | ICD-10-CM | POA: Diagnosis not present

## 2020-06-03 DIAGNOSIS — M9901 Segmental and somatic dysfunction of cervical region: Secondary | ICD-10-CM | POA: Diagnosis not present

## 2020-06-03 DIAGNOSIS — M9902 Segmental and somatic dysfunction of thoracic region: Secondary | ICD-10-CM | POA: Diagnosis not present

## 2020-06-03 DIAGNOSIS — M9903 Segmental and somatic dysfunction of lumbar region: Secondary | ICD-10-CM | POA: Diagnosis not present

## 2020-08-04 ENCOUNTER — Ambulatory Visit: Payer: 59 | Attending: Internal Medicine

## 2020-08-04 DIAGNOSIS — Z23 Encounter for immunization: Secondary | ICD-10-CM

## 2020-08-04 NOTE — Progress Notes (Signed)
   Covid-19 Vaccination Clinic  Name:  Juan Rodriguez    MRN: [id number] DOB: 1981/04/16  08/04/2020  Mr. [name] was observed post Covid-19 immunization for 15 minutes without incident. He was provided with Vaccine Information Sheet and instruction to access the V-Safe system.   Mr. [name] was instructed to call 911 with any severe reactions post vaccine: Marland Kitchen Difficulty breathing  . Swelling of face and throat  . A fast heartbeat  . A bad rash all over body  . Dizziness and weakness   Immunizations Administered    Name Date Dose VIS Date Route   Pfizer COVID-19 Vaccine 08/04/2020  1:31 PM 0.3 mL 05/18/2020 Intramuscular   Manufacturer: [employer], [employer]   Lot: [id number]   NDC: [address]-3

## 2020-08-17 DIAGNOSIS — M9902 Segmental and somatic dysfunction of thoracic region: Secondary | ICD-10-CM | POA: Diagnosis not present

## 2020-08-17 DIAGNOSIS — M5413 Radiculopathy, cervicothoracic region: Secondary | ICD-10-CM | POA: Diagnosis not present

## 2020-08-17 DIAGNOSIS — M9903 Segmental and somatic dysfunction of lumbar region: Secondary | ICD-10-CM | POA: Diagnosis not present

## 2020-08-17 DIAGNOSIS — M546 Pain in thoracic spine: Secondary | ICD-10-CM | POA: Diagnosis not present

## 2020-08-17 DIAGNOSIS — M9901 Segmental and somatic dysfunction of cervical region: Secondary | ICD-10-CM | POA: Diagnosis not present

## 2020-10-19 ENCOUNTER — Encounter: Payer: Self-pay | Admitting: Family Medicine

## 2020-10-19 ENCOUNTER — Other Ambulatory Visit: Payer: Self-pay

## 2020-10-19 ENCOUNTER — Other Ambulatory Visit (HOSPITAL_COMMUNITY): Payer: Self-pay | Admitting: Family Medicine

## 2020-10-19 ENCOUNTER — Ambulatory Visit: Payer: 59 | Admitting: Family Medicine

## 2020-10-19 VITALS — BP 126/84 | Ht 68.0 in | Wt 240.0 lb

## 2020-10-19 DIAGNOSIS — M79602 Pain in left arm: Secondary | ICD-10-CM | POA: Diagnosis not present

## 2020-10-19 MED ORDER — PREDNISONE 10 MG PO TABS: ORAL_TABLET | ORAL | 0 refills | Status: DC

## 2020-10-19 MED FILL — predniSONE 10 MG TABS: 10 | 6 days supply | Qty: 21 | Fill #0

## 2020-10-19 NOTE — Progress Notes (Signed)
PCP: Benay Pike, MD  Subjective:   HPI: Patient is a 40 y.o. male here for left arm pain.  Patient reports for about 1-1.5 months he's had lateral left arm/shoulder pain radiating down into wrist and digits. Pain worse with full elbow extension trying to lift items. Feels this when turning head and with pressure in trapezius area. No numbness/tingling. Not tried medications for this but did see chiropractor about a month ago with transient improvement with treatment. No right sided issues.  Past Medical History:  Diagnosis Date  . Diverticulitis     Current Outpatient Medications on File Prior to Visit  Medication Sig Dispense Refill  . hydrocortisone (ANUSOL-HC) 2.5 % rectal cream Place 1 application rectally 2 (two) times daily. 30 g 0  . triamcinolone cream (KENALOG) 0.1 % Apply 1 application topically 2 (two) times daily.     No current facility-administered medications on file prior to visit.    Past Surgical History:  Procedure Laterality Date  . EYE SURGERY     Lasix  . WISDOM TOOTH EXTRACTION      No Known Allergies  Social History   Socioeconomic History  . Marital status: Married    Spouse name: Not on file  . Number of children: 2  . Years of education: 58  . Highest education level: Not on file  Occupational History  . Occupation: Glass blower/designer  Tobacco Use  . Smoking status: Never Smoker  . Smokeless tobacco: Never Used  Substance and Sexual Activity  . Alcohol use: Yes    Comment: occasional  . Drug use: No  . Sexual activity: Yes  Other Topics Concern  . Not on file  Social History Narrative   Fun: Whatever his kids want to do.   Kids ages 4 & 84   Denies religious beliefs effecting health care.    Social Determinants of Health   Financial Resource Strain: Not on file  Food Insecurity: Not on file  Transportation Needs: Not on file  Physical Activity: Not on file  Stress: Not on file  Social Connections: Not on file  Intimate  Partner Violence: Not on file    Family History  Problem Relation Age of Onset  . Crohn's disease Mother   . Healthy Father   . Alzheimer's disease Maternal Grandmother   . Healthy Paternal Grandmother     BP 126/84   Ht 5\' 8"  (1.727 m)   Wt 240 lb (108.9 kg)   BMI 36.49 kg/m   No flowsheet data found.  No flowsheet data found.  Review of Systems: See HPI above.     Objective:  Physical Exam:  Gen: NAD, comfortable in exam room  Neck: No gross deformity, swelling, bruising. TTP left trapezius, cervical paraspinal region.  No midline/bony TTP. Extension only to 10 degrees, full motion other directions. BUE strength 5/5.   Sensation intact to light touch.   2+ equal reflexes in triceps, biceps, brachioradialis tendons. Mild positive spurlings on left - symptoms also radiated in same distribution on palpation left trapezius.  Left shoulder: No swelling, ecchymoses.  No gross deformity. No TTP. FROM. Negative Hawkins, Neers. Strength 5/5 with empty can and resisted internal/external rotation. Negative apprehension. NV intact distally.   Assessment & Plan:  1. Left arm pain - history, exam consistent with cervical radiculopathy as cause of his left arm pain.  Discussed options.  Will start prednisone dose pack, heat.  Ergonomic issues reviewed.  Let us know how he's doing in a week.  Consider MRI if not improving, PT if improving.

## 2020-10-19 NOTE — Patient Instructions (Signed)
Your pain and exam are consistent with an irritated nerve from the neck. Prednisone 6 day dose pack to relieve irritation/inflammation of the nerve. Do not take aleve or ibuprofen while on the prednisone. Consider cervical collar if severely painful. Simple range of motion exercises within limits of pain to prevent further stiffness. Consider physical therapy for stretching, exercises, traction, and modalities in the future. Heat 15 minutes at a time 3-4 times a day to help with spasms. Watch head position when on computers, texting, when sleeping in bed - should in line with back to prevent further nerve traction and irritation. Consider home traction unit if you get benefit with this in physical therapy. If not improving we will consider an MRI. Let me know how you're doing in a week either way (ok to just call).

## 2020-11-23 DIAGNOSIS — M5413 Radiculopathy, cervicothoracic region: Secondary | ICD-10-CM | POA: Diagnosis not present

## 2020-11-23 DIAGNOSIS — M546 Pain in thoracic spine: Secondary | ICD-10-CM | POA: Diagnosis not present

## 2020-11-23 DIAGNOSIS — M9901 Segmental and somatic dysfunction of cervical region: Secondary | ICD-10-CM | POA: Diagnosis not present

## 2020-11-23 DIAGNOSIS — M9902 Segmental and somatic dysfunction of thoracic region: Secondary | ICD-10-CM | POA: Diagnosis not present

## 2020-12-23 DIAGNOSIS — M546 Pain in thoracic spine: Secondary | ICD-10-CM | POA: Diagnosis not present

## 2020-12-23 DIAGNOSIS — M9902 Segmental and somatic dysfunction of thoracic region: Secondary | ICD-10-CM | POA: Diagnosis not present

## 2020-12-23 DIAGNOSIS — M5413 Radiculopathy, cervicothoracic region: Secondary | ICD-10-CM | POA: Diagnosis not present

## 2020-12-23 DIAGNOSIS — M9901 Segmental and somatic dysfunction of cervical region: Secondary | ICD-10-CM | POA: Diagnosis not present

## 2021-01-26 ENCOUNTER — Ambulatory Visit (INDEPENDENT_AMBULATORY_CARE_PROVIDER_SITE_OTHER): Payer: 59 | Admitting: Family Medicine

## 2021-01-26 ENCOUNTER — Encounter (HOSPITAL_BASED_OUTPATIENT_CLINIC_OR_DEPARTMENT_OTHER): Payer: Self-pay | Admitting: Family Medicine

## 2021-01-26 ENCOUNTER — Other Ambulatory Visit: Payer: Self-pay

## 2021-01-26 VITALS — BP 136/92 | HR 84 | Ht 68.0 in | Wt 240.4 lb

## 2021-01-26 DIAGNOSIS — Z0000 Encounter for general adult medical examination without abnormal findings: Secondary | ICD-10-CM | POA: Diagnosis not present

## 2021-01-26 NOTE — Patient Instructions (Signed)
  Medication Instructions:  Your physician recommends that you continue on your current medications as directed. Please refer to the Current Medication list given to you today. --If you need a refill on any your medications before your next appointment, please call your pharmacy first. If no refills are authorized on file call the office.--  Lab Work: Your physician has recommended that you have lab work today: CBC, BMET, and Lipid Profile If you have labs (blood work) drawn today and your tests are completely normal, you will receive your results via [place] a phone call from our staff.  Please ensure you check your voicemail in the event that you authorized detailed messages to be left on a delegated number. If you have any lab test that is abnormal or we need to change your treatment, we will call you to review the results.  Follow-Up: Your next appointment:   Your physician recommends that you schedule a follow-up appointment in: March/April 2023 with Dr. de [place]  Thanks for letting us be apart of your health journey!!  Primary Care and Sports Medicine   Dr. Arlina Robes [place]   We encourage you to activate your patient portal called "MyChart".  Sign up information is provided on this After Visit Summary.  MyChart is used to connect with patients for Virtual Visits (Telemedicine).  Patients are able to view lab/test results, encounter notes, upcoming appointments, etc.  Non-urgent messages can be sent to your provider as well. To learn more about what you can do with MyChart, please visit --  [email or web address].

## 2021-01-26 NOTE — Assessment & Plan Note (Signed)
Overall, well-appearing male Has been greater than 5 years since last labs, will check CBC, BMP, lipid panel Encouraged to continue with regular dental visits/screenings Recommended least 150 minutes of moderate intensity aerobic exercise weekly, can gradually increase weekly volume until able to achieve this goal Utilizing above, recommend gradual healthy weight loss

## 2021-01-26 NOTE — Progress Notes (Signed)
New Patient Office Visit  Subjective:  Patient ID: Juan Rodriguez, male    DOB: 1980-08-01  Age: 40 y.o. MRN: [id number]  CC:  Chief Complaint  Patient presents with   Establish Care    Prior PCP not with [facility] and pt is unaware of name   Annual Exam    Patient presents today for CPE, patient is a [employer] employee    HPI Juan Rodriguez is a 40 year old male presenting to establish in clinic.  Patient is also needing physical exam completed today.  He denies any current complaints.  Reports past medical history of diverticulitis -was having intermittent symptoms of abdominal pain and diarrhea and has family history of Crohn's disease.  Had colonoscopy performed with findings of diverticulosis.  Patient works as a Clinical biochemist.  Wife works for [employer], reports that she was in an outpatient setting but has now moved to an inpatient setting. Not currently taking any medications or over-the-counter supplements Last dental visit was about 6 months ago No recent eye exam, denies any issues with vision at this time Denies any tobacco use, reports rare alcohol use Last tetanus was about 5 years ago Has been very than 5 years since last labs were completed  Past Medical History:  Diagnosis Date   Diverticulitis     Past Surgical History:  Procedure Laterality Date   EYE SURGERY     Lasix   WISDOM TOOTH EXTRACTION      Family History  Problem Relation Age of Onset   Crohn's disease Mother    Healthy Father    Alzheimer's disease Maternal Grandmother    Heart disease Maternal Grandfather    Heart attack Maternal Grandfather    Healthy Paternal Grandmother    Cancer Paternal Grandfather        lung    Social History   Socioeconomic History   Marital status: Married    Spouse name: Not on file   Number of children: 2   Years of education: 14   Highest education level: Not on file  Occupational History   Occupation: Glass blower/designer  Tobacco Use   Smoking  status: Never   Smokeless tobacco: Never  Vaping Use   Vaping Use: Never used  Substance and Sexual Activity   Alcohol use: Yes    Comment: occasionally   Drug use: No   Sexual activity: Yes  Other Topics Concern   Not on file  Social History Narrative   Fun: Whatever his kids want to do.   Kids ages 52 & 29   Denies religious beliefs effecting health care.    Social Determinants of Health   Financial Resource Strain: Not on file  Food Insecurity: Not on file  Transportation Needs: Not on file  Physical Activity: Not on file  Stress: Not on file  Social Connections: Not on file  Intimate Partner Violence: Not on file    Objective:   Today's Vitals: BP (!) 136/92   Pulse 84   Ht 5\' 8"  (1.727 m)   Wt 240 lb 6.4 oz (109 kg)   SpO2 98%   BMI 36.55 kg/m   Physical Exam Constitutional:      General: He is not in acute distress.    Appearance: He is obese.  HENT:     Head: Normocephalic and atraumatic.     Right Ear: Tympanic membrane, ear canal and external ear normal.     Left Ear: Tympanic membrane, ear canal and external  ear normal.     Nose: Nose normal.     Mouth/Throat:     Mouth: Mucous membranes are moist.     Pharynx: Oropharynx is clear.  Eyes:     Extraocular Movements: Extraocular movements intact.     Conjunctiva/sclera: Conjunctivae normal.     Pupils: Pupils are equal, round, and reactive to light.  Cardiovascular:     Rate and Rhythm: Normal rate and regular rhythm.     Pulses: Normal pulses.     Heart sounds: Normal heart sounds. No murmur heard. Pulmonary:     Effort: Pulmonary effort is normal.     Breath sounds: Normal breath sounds. No wheezing.  Abdominal:     General: Bowel sounds are normal. There is no distension.     Palpations: Abdomen is soft.     Tenderness: There is no abdominal tenderness. There is no guarding.  Musculoskeletal:     Cervical back: Normal range of motion. No tenderness.  Skin:    General: Skin is warm.      Capillary Refill: Capillary refill takes less than 2 seconds.     Coloration: Skin is not jaundiced.     Findings: No rash.  Neurological:     General: No focal deficit present.     Mental Status: He is alert and oriented to person, place, and time.     Gait: Gait normal.  Psychiatric:        Mood and Affect: Mood normal.        Behavior: Behavior normal.    Assessment & Plan:   Problem List Items Addressed This Visit       Other   Healthy adult on routine physical examination - Primary    Overall, well-appearing male Has been greater than 5 years since last labs, will check CBC, BMP, lipid panel Encouraged to continue with regular dental visits/screenings Recommended least 150 minutes of moderate intensity aerobic exercise weekly, can gradually increase weekly volume until able to achieve this goal Utilizing above, recommend gradual healthy weight loss       Relevant Orders   Basic metabolic panel   CBC with Differential/Platelet   Lipid panel    Outpatient Encounter Medications as of 01/26/2021  Medication Sig   [DISCONTINUED] hydrocortisone (ANUSOL-HC) 2.5 % rectal cream Place 1 application rectally 2 (two) times daily.   [DISCONTINUED] predniSONE (DELTASONE) 10 MG tablet 6 tabs po day 1, 5 tabs po day 2, 4 tabs po day 3, 3 tabs po day 4, 2 tabs po day 5, 1 tab po day 6   [DISCONTINUED] predniSONE (DELTASONE) 10 MG tablet TAKE 6 TABS BY MOUTH ON DAY 1; 5 TABS ON DAY 2; 4 TABS ON DAY 3; 3 TABS ON DAY 4; 2 TABS ON DAY 5; 1 TAB ON DAY 6 THEN STOP   [DISCONTINUED] triamcinolone cream (KENALOG) 0.1 % Apply 1 application topically 2 (two) times daily.   No facility-administered encounter medications on file as of 01/26/2021.    Follow-up: Return in about 9 months (around 10/28/2021).   Josie Burleigh J De [place], MD

## 2021-01-27 LAB — BASIC METABOLIC PANEL
BUN/Creatinine Ratio: 8 — ABNORMAL LOW (ref 9–20)
BUN: 8 mg/dL (ref 6–24)
CO2: 23 mmol/L (ref 20–29)
Calcium: 9.3 mg/dL (ref 8.7–10.2)
Chloride: 105 mmol/L (ref 96–106)
Creatinine, Ser: 1.05 mg/dL (ref 0.76–1.27)
Glucose: 79 mg/dL (ref 65–99)
Potassium: 4.1 mmol/L (ref 3.5–5.2)
Sodium: 143 mmol/L (ref 134–144)
eGFR: 92 mL/min/{1.73_m2} (ref >59)

## 2021-01-27 LAB — CBC WITH DIFFERENTIAL/PLATELET
Basophils Absolute: 0 10*3/uL (ref 0.0–0.2)
Basos: 1 %
EOS (ABSOLUTE): 0.2 10*3/uL (ref 0.0–0.4)
Eos: 3 %
Hematocrit: 45.6 % (ref 37.5–51.0)
Hemoglobin: 16.1 g/dL (ref 13.0–17.7)
Immature Grans (Abs): 0 10*3/uL (ref 0.0–0.1)
Immature Granulocytes: 0 %
Lymphocytes Absolute: 2.2 10*3/uL (ref 0.7–3.1)
Lymphs: 31 %
MCH: 30.7 pg (ref 26.6–33.0)
MCHC: 35.3 g/dL (ref 31.5–35.7)
MCV: 87 fL (ref 79–97)
Monocytes Absolute: 0.5 10*3/uL (ref 0.1–0.9)
Monocytes: 7 %
Neutrophils Absolute: 4.3 10*3/uL (ref 1.4–7.0)
Neutrophils: 58 %
Platelets: 257 10*3/uL (ref 150–450)
RBC: 5.24 x10E6/uL (ref 4.14–5.80)
RDW: 13.1 % (ref 11.6–15.4)
WBC: 7.2 10*3/uL (ref 3.4–10.8)

## 2021-01-27 LAB — LIPID PANEL
Chol/HDL Ratio: 5.5 ratio — ABNORMAL HIGH (ref 0.0–5.0)
Cholesterol, Total: 218 mg/dL — ABNORMAL HIGH (ref 100–199)
HDL: 40 mg/dL (ref >39)
LDL Chol Calc (NIH): 139 mg/dL — ABNORMAL HIGH (ref 0–99)
Triglycerides: 216 mg/dL — ABNORMAL HIGH (ref 0–149)
VLDL Cholesterol Cal: 39 mg/dL (ref 5–40)

## 2021-02-03 ENCOUNTER — Telehealth (HOSPITAL_BASED_OUTPATIENT_CLINIC_OR_DEPARTMENT_OTHER): Payer: Self-pay

## 2021-02-03 NOTE — Telephone Encounter (Signed)
Patient is aware and agreeable with lab results and recommendations

## 2021-02-03 NOTE — Telephone Encounter (Signed)
-----   Message from Raymond J de [place], MD sent at 02/01/2021 10:10 AM EDT ----- Normal white blood cell and red blood cell counts with normal hemoglobin.  Electrolytes and kidney function are normal. Lipid panel with elevated total cholesterol and elevated "bad" cholesterol.  Would initially focus on lifestyle modifications to address cholesterol issues.  This will include dietary changes - incorporating fresh fruits and vegetables, lean protein in the diet and reducing consumption of red meats, saturated fats, processed foods.  Recommend gradually increasing level of activity as discussed in the office.  Eventual goal should be started about 150 minutes/week of moderate intensity aerobic exercise.

## 2021-03-01 DIAGNOSIS — M9903 Segmental and somatic dysfunction of lumbar region: Secondary | ICD-10-CM | POA: Diagnosis not present

## 2021-03-01 DIAGNOSIS — M546 Pain in thoracic spine: Secondary | ICD-10-CM | POA: Diagnosis not present

## 2021-03-01 DIAGNOSIS — M9901 Segmental and somatic dysfunction of cervical region: Secondary | ICD-10-CM | POA: Diagnosis not present

## 2021-03-01 DIAGNOSIS — M9902 Segmental and somatic dysfunction of thoracic region: Secondary | ICD-10-CM | POA: Diagnosis not present

## 2021-03-01 DIAGNOSIS — M5413 Radiculopathy, cervicothoracic region: Secondary | ICD-10-CM | POA: Diagnosis not present

## 2021-06-21 DIAGNOSIS — M546 Pain in thoracic spine: Secondary | ICD-10-CM | POA: Diagnosis not present

## 2021-06-21 DIAGNOSIS — M5413 Radiculopathy, cervicothoracic region: Secondary | ICD-10-CM | POA: Diagnosis not present

## 2021-06-21 DIAGNOSIS — M9902 Segmental and somatic dysfunction of thoracic region: Secondary | ICD-10-CM | POA: Diagnosis not present

## 2021-06-21 DIAGNOSIS — M9901 Segmental and somatic dysfunction of cervical region: Secondary | ICD-10-CM | POA: Diagnosis not present

## 2021-10-26 ENCOUNTER — Encounter (HOSPITAL_BASED_OUTPATIENT_CLINIC_OR_DEPARTMENT_OTHER): Payer: Self-pay | Admitting: Family Medicine

## 2021-10-26 ENCOUNTER — Ambulatory Visit (INDEPENDENT_AMBULATORY_CARE_PROVIDER_SITE_OTHER): Payer: 59 | Admitting: Family Medicine

## 2021-10-26 VITALS — BP 122/82 | HR 86 | Temp 98.7°F | Ht 68.0 in | Wt 235.8 lb

## 2021-10-26 DIAGNOSIS — Z0000 Encounter for general adult medical examination without abnormal findings: Secondary | ICD-10-CM

## 2021-10-26 NOTE — Patient Instructions (Signed)
?  Medication Instructions:  ?Your physician recommends that you continue on your current medications as directed. Please refer to the Current Medication list given to you today. ?--If you need a refill on any your medications before your next appointment, please call your pharmacy first. If no refills are authorized on file call the office.-- ?Lab Work: ?Your physician has recommended that you have lab work today: full lab panel ?If you have labs (blood work) drawn today and your tests are completely normal, you will receive your results via [place] a phone call from our staff.  ?Please ensure you check your voicemail in the event that you authorized detailed messages to be left on a delegated number. If you have any lab test that is abnormal or we need to change your treatment, we will call you to review the results. ? ? ? ?Follow-Up: ?Your next appointment:   ?Your physician recommends that you schedule a follow-up appointment in: 1 year follow up with Dr. Tennis Must [place] ? ?You will receive a text message or e-mail with a link to a survey about your care and experience with [place] today! We would greatly appreciate your feedback!  ? ?Thanks for letting us be apart of your health journey!!  ?Primary Care and Sports Medicine  ? ?Dr. Kyung Rudd de [place]  ? ?We encourage you to activate your patient portal called "MyChart".  Sign up information is provided on this After Visit Summary.  MyChart is used to connect with patients for Virtual Visits (Telemedicine).  Patients are able to view lab/test results, encounter notes, upcoming appointments, etc.  Non-urgent messages can be sent to your provider as well. To learn more about what you can do with MyChart, please visit --  [email or web address].    ?

## 2021-10-26 NOTE — Progress Notes (Signed)
?Subjective:   ? ?CC: Annual Physical Exam ? ?HPI:  ?Juan Rodriguez is a 41 y.o. presenting for annual physical ? ?I reviewed the past medical history, family history, social history, surgical history, and allergies today and no changes were needed.  Please see the problem list section below in epic for further details. ? ?Past Medical History: ?Past Medical History:  ?Diagnosis Date  ? Diverticulitis   ? ?Past Surgical History: ?Past Surgical History:  ?Procedure Laterality Date  ? EYE SURGERY    ? Lasix  ? WISDOM TOOTH EXTRACTION    ? ?Social History: ?Social History  ? ?Socioeconomic History  ? Marital status: Married  ?  Spouse name: Not on file  ? Number of children: 2  ? Years of education: 34  ? Highest education level: Not on file  ?Occupational History  ? Occupation: Glass blower/designer  ?Tobacco Use  ? Smoking status: Never  ? Smokeless tobacco: Never  ?Vaping Use  ? Vaping Use: Never used  ?Substance and Sexual Activity  ? Alcohol use: Yes  ?  Comment: occasionally  ? Drug use: No  ? Sexual activity: Yes  ?Other Topics Concern  ? Not on file  ?Social History Narrative  ? Fun: Whatever his kids want to do.  ? Kids ages 87 & 30  ? Denies religious beliefs effecting health care.   ? ?Social Determinants of Health  ? ?Financial Resource Strain: Not on file  ?Food Insecurity: Not on file  ?Transportation Needs: Not on file  ?Physical Activity: Not on file  ?Stress: Not on file  ?Social Connections: Not on file  ? ?Family History: ?Family History  ?Problem Relation Age of Onset  ? Crohn's disease Mother   ? Healthy Father   ? Alzheimer's disease Maternal Grandmother   ? Heart disease Maternal Grandfather   ? Heart attack Maternal Grandfather   ? Healthy Paternal Grandmother   ? Cancer Paternal Grandfather   ?     lung  ? ?Allergies: ?No Known Allergies ?Medications: See med rec. ? ?Review of Systems: No headache, visual changes, nausea, vomiting, diarrhea, constipation, dizziness, abdominal pain, skin rash, fevers,  chills, night sweats, swollen lymph nodes, weight loss, chest pain, body aches, joint swelling, muscle aches, shortness of breath, mood changes, visual or auditory hallucinations. ? ?Objective:   ? ?BP 122/82   Pulse 86   Temp 98.7 ?F (37.1 ?C)   Ht '5\' 8"'$  (1.727 m)   Wt 235 lb 12.8 oz (107 kg)   SpO2 97%   BMI 35.85 kg/m?  ? ?General: Well Developed, well nourished, and in no acute distress.  ?Neuro: Alert and oriented x3, extra-ocular muscles intact, sensation grossly intact. Cranial nerves II through XII are intact, motor, sensory, and coordinative functions are all intact. ?HEENT: Normocephalic, atraumatic, pupils equal round reactive to light, neck supple, no masses, no lymphadenopathy, thyroid nonpalpable. Oropharynx, nasopharynx, external ear canals are unremarkable. ?Skin: Warm and dry, no rashes noted.  ?Cardiac: Regular rate and rhythm, no murmurs rubs or gallops.  ?Respiratory: Clear to auscultation bilaterally. Not using accessory muscles, speaking in full sentences.  ?Abdominal: Soft, nontender, nondistended, positive bowel sounds, no masses, no organomegaly.  ?Musculoskeletal: Shoulder, elbow, wrist, hip, knee, ankle stable, and with full range of motion. ? ?Impression and Recommendations:   ? ?Wellness examination ?Routine HCM labs ordered. HCM reviewed/discussed. Anticipatory guidance regarding healthy weight, lifestyle and choices given. ?Recommend healthy diet.  Recommend approximately 150 minutes/week of moderate intensity exercise ?Recommend regular dental and vision  exams ?Always use seatbelt/lap and shoulder restraints ?Recommend using smoke alarms and checking batteries at least twice a year ?Recommend using sunscreen when outside ? ?Plan for follow-up in about 1 year for CPE ? ? ?___________________________________________ ?Juan Salatino de [place], MD, ABFM, CAQSM ?Primary Care and Sports Medicine ?[facility] ?

## 2021-10-26 NOTE — Assessment & Plan Note (Addendum)
Routine HCM labs ordered. HCM reviewed/discussed. Anticipatory guidance regarding healthy weight, lifestyle and choices given. ?Recommend healthy diet.  Recommend approximately 150 minutes/week of moderate intensity exercise ?Recommend regular dental and vision exams ?Always use seatbelt/lap and shoulder restraints ?Recommend using smoke alarms and checking batteries at least twice a year ?Recommend using sunscreen when outside ?No family history of colon cancer which would warrant earlier screening - did have colonoscopy and was diagnosed with diverticulitis due to symptoms, there was concern for Crohn's given that his mother is diagnosed with this ?He is UTD on Tetanus ?

## 2021-10-27 LAB — COMPREHENSIVE METABOLIC PANEL
ALT: 33 IU/L (ref 0–44)
AST: 31 IU/L (ref 0–40)
Albumin/Globulin Ratio: 2 (ref 1.2–2.2)
Albumin: 4.7 g/dL (ref 4.0–5.0)
Alkaline Phosphatase: 56 IU/L (ref 44–121)
BUN/Creatinine Ratio: 13 (ref 9–20)
BUN: 12 mg/dL (ref 6–24)
Bilirubin Total: 1 mg/dL (ref 0.0–1.2)
CO2: 24 mmol/L (ref 20–29)
Calcium: 9.3 mg/dL (ref 8.7–10.2)
Chloride: 104 mmol/L (ref 96–106)
Creatinine, Ser: 0.9 mg/dL (ref 0.76–1.27)
Globulin, Total: 2.4 g/dL (ref 1.5–4.5)
Glucose: 96 mg/dL (ref 70–99)
Potassium: 4.7 mmol/L (ref 3.5–5.2)
Sodium: 140 mmol/L (ref 134–144)
Total Protein: 7.1 g/dL (ref 6.0–8.5)
eGFR: 110 mL/min/{1.73_m2} (ref >59)

## 2021-10-27 LAB — CBC WITH DIFFERENTIAL/PLATELET
Basophils Absolute: 0 10*3/uL (ref 0.0–0.2)
Basos: 1 %
EOS (ABSOLUTE): 0.2 10*3/uL (ref 0.0–0.4)
Eos: 3 %
Hematocrit: 46.4 % (ref 37.5–51.0)
Hemoglobin: 15.9 g/dL (ref 13.0–17.7)
Immature Grans (Abs): 0 10*3/uL (ref 0.0–0.1)
Immature Granulocytes: 0 %
Lymphocytes Absolute: 1.9 10*3/uL (ref 0.7–3.1)
Lymphs: 31 %
MCH: 30 pg (ref 26.6–33.0)
MCHC: 34.3 g/dL (ref 31.5–35.7)
MCV: 88 fL (ref 79–97)
Monocytes Absolute: 0.4 10*3/uL (ref 0.1–0.9)
Monocytes: 7 %
Neutrophils Absolute: 3.6 10*3/uL (ref 1.4–7.0)
Neutrophils: 58 %
Platelets: 310 10*3/uL (ref 150–450)
RBC: 5.3 x10E6/uL (ref 4.14–5.80)
RDW: 12.3 % (ref 11.6–15.4)
WBC: 6.1 10*3/uL (ref 3.4–10.8)

## 2021-10-27 LAB — LIPID PANEL
Chol/HDL Ratio: 4.1 ratio (ref 0.0–5.0)
Cholesterol, Total: 203 mg/dL — ABNORMAL HIGH (ref 100–199)
HDL: 50 mg/dL (ref >39)
LDL Chol Calc (NIH): 141 mg/dL — ABNORMAL HIGH (ref 0–99)
Triglycerides: 64 mg/dL (ref 0–149)
VLDL Cholesterol Cal: 12 mg/dL (ref 5–40)

## 2021-10-27 LAB — HEMOGLOBIN A1C
Est. average glucose Bld gHb Est-mCnc: 105 mg/dL
Hgb A1c MFr Bld: 5.3 % (ref 4.8–5.6)

## 2021-10-27 LAB — TSH RFX ON ABNORMAL TO FREE T4: TSH: 1.6 u[IU]/mL (ref 0.450–4.500)

## 2021-11-01 IMAGING — CR DG HAND COMPLETE 3+V*R*
3 series · 3 of 3 positions shown · non-contrast
Comparison: None.

CLINICAL DATA: Pain and swelling of the right hand since a fall on
06/21/2019.

EXAM:
RIGHT HAND - COMPLETE 3+ VIEW

[x hand pa right]
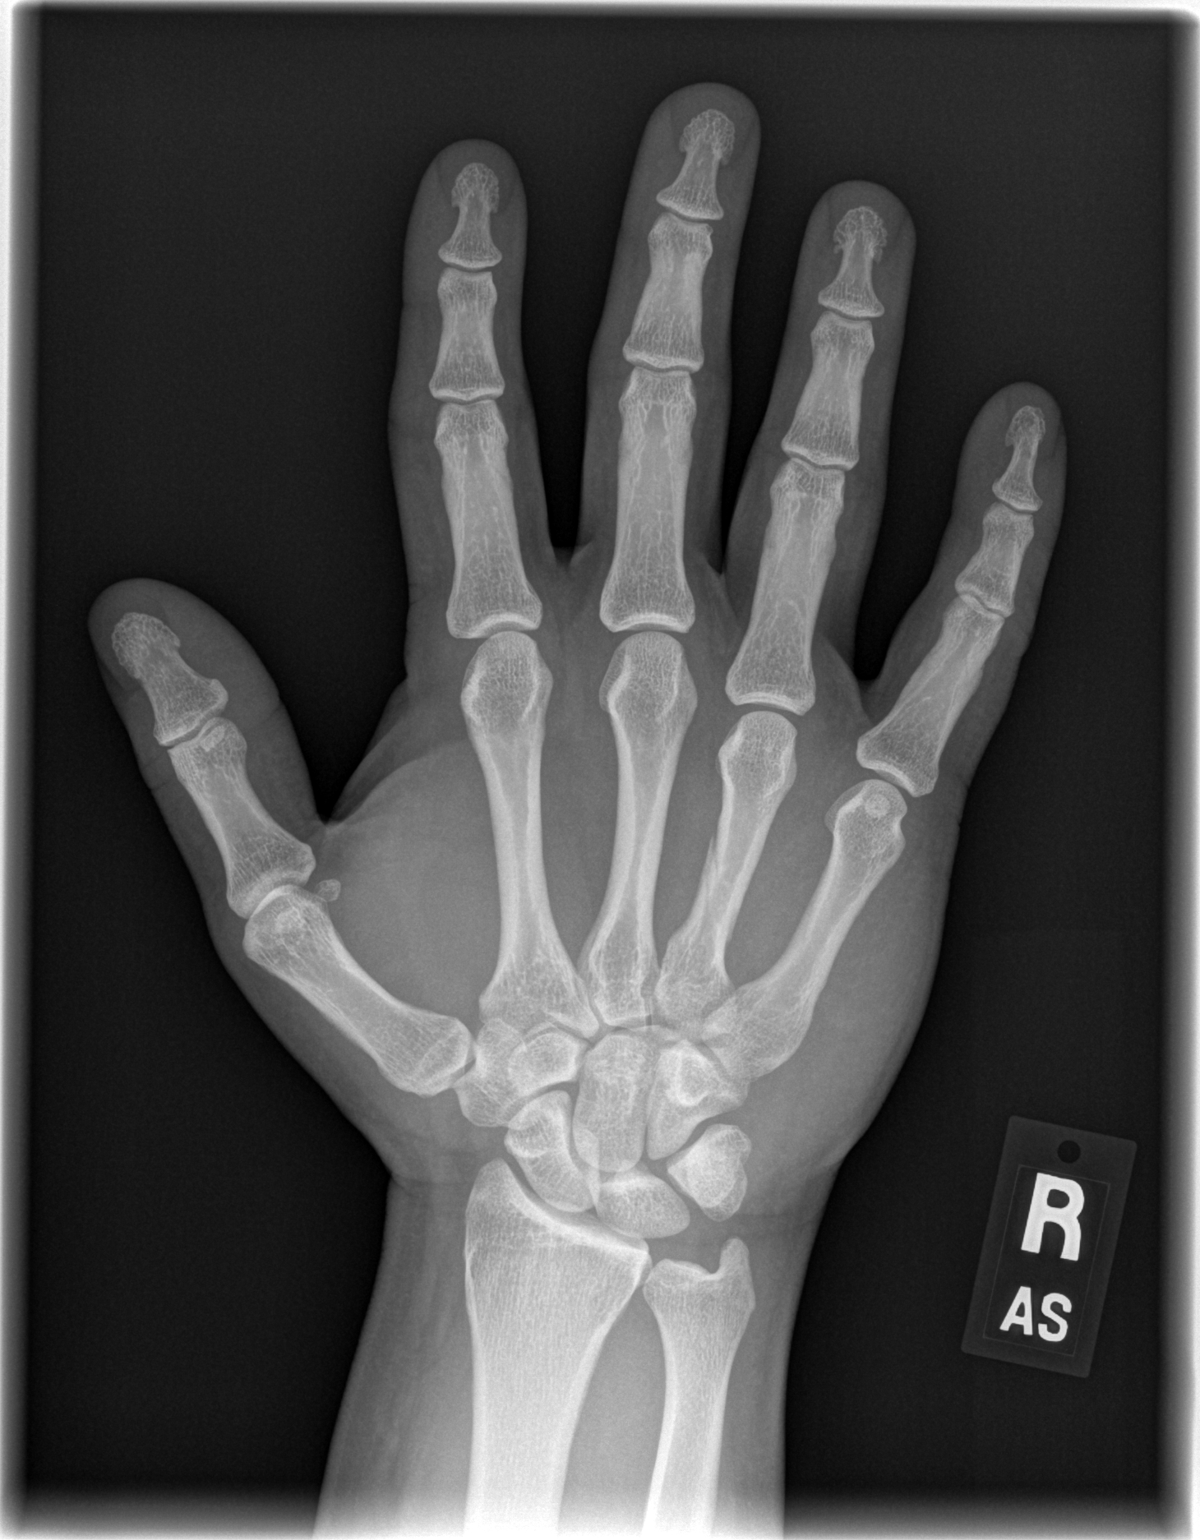

[x hand oblique right]
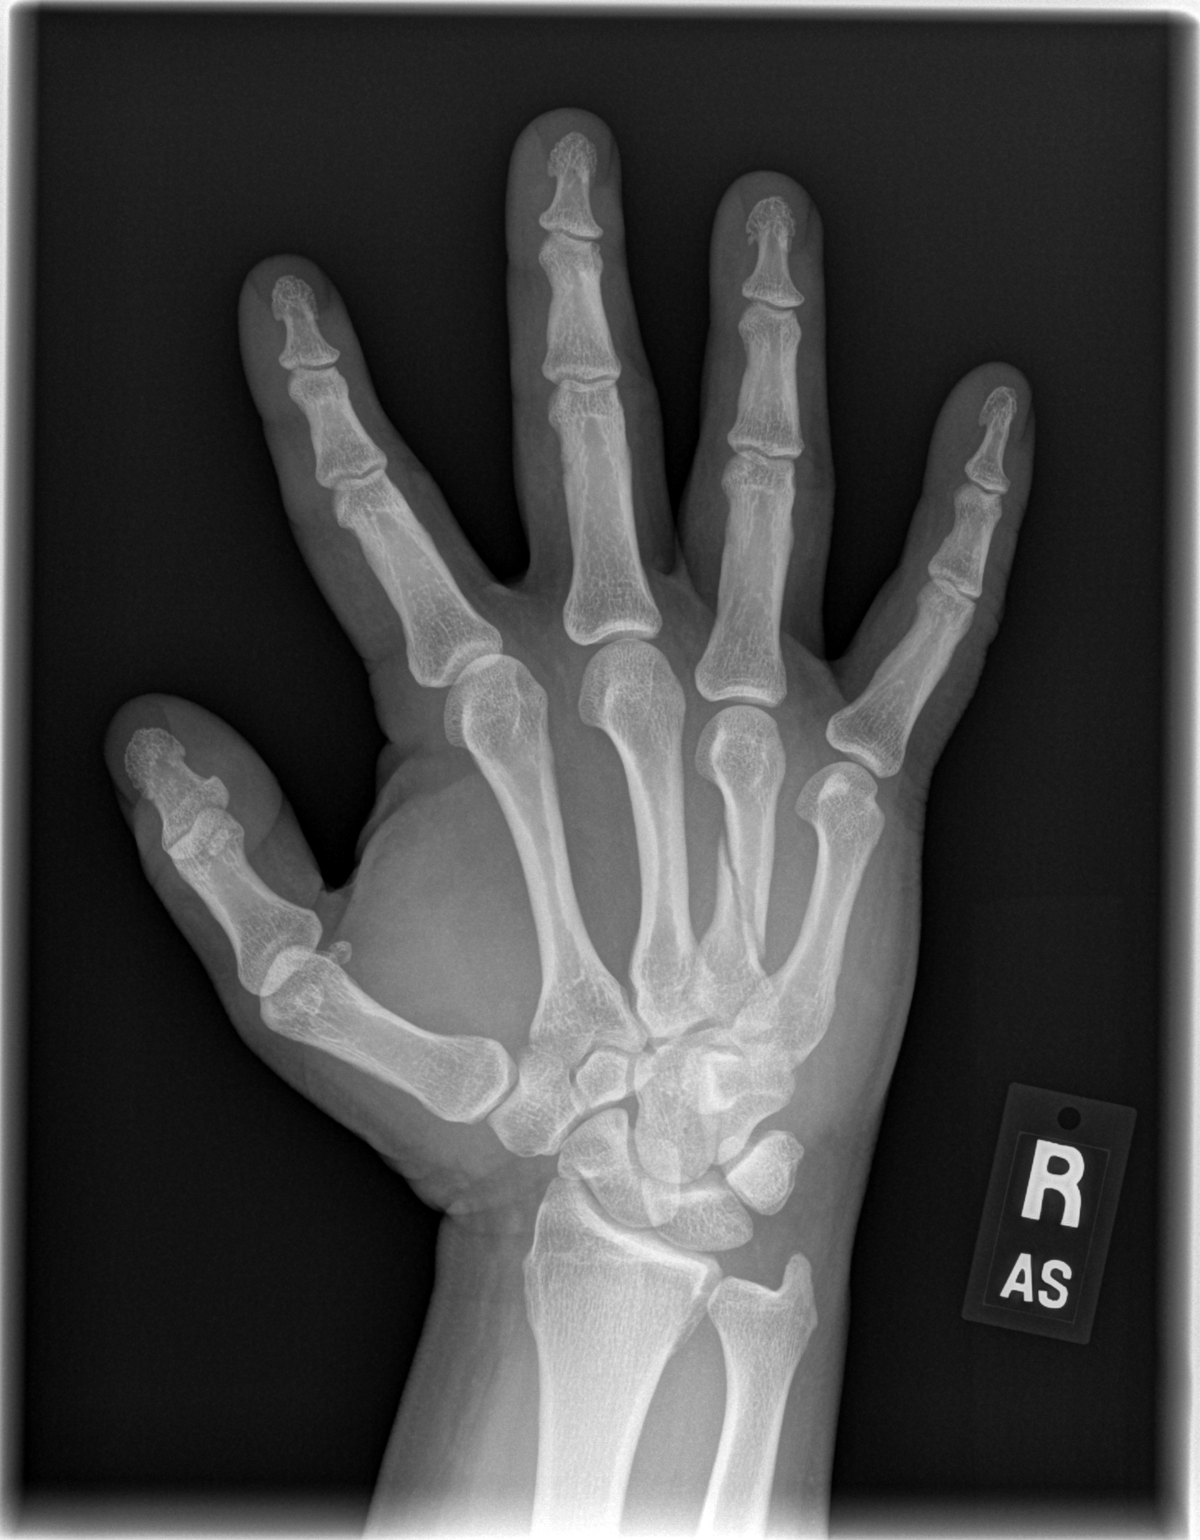

[x hand lat right]
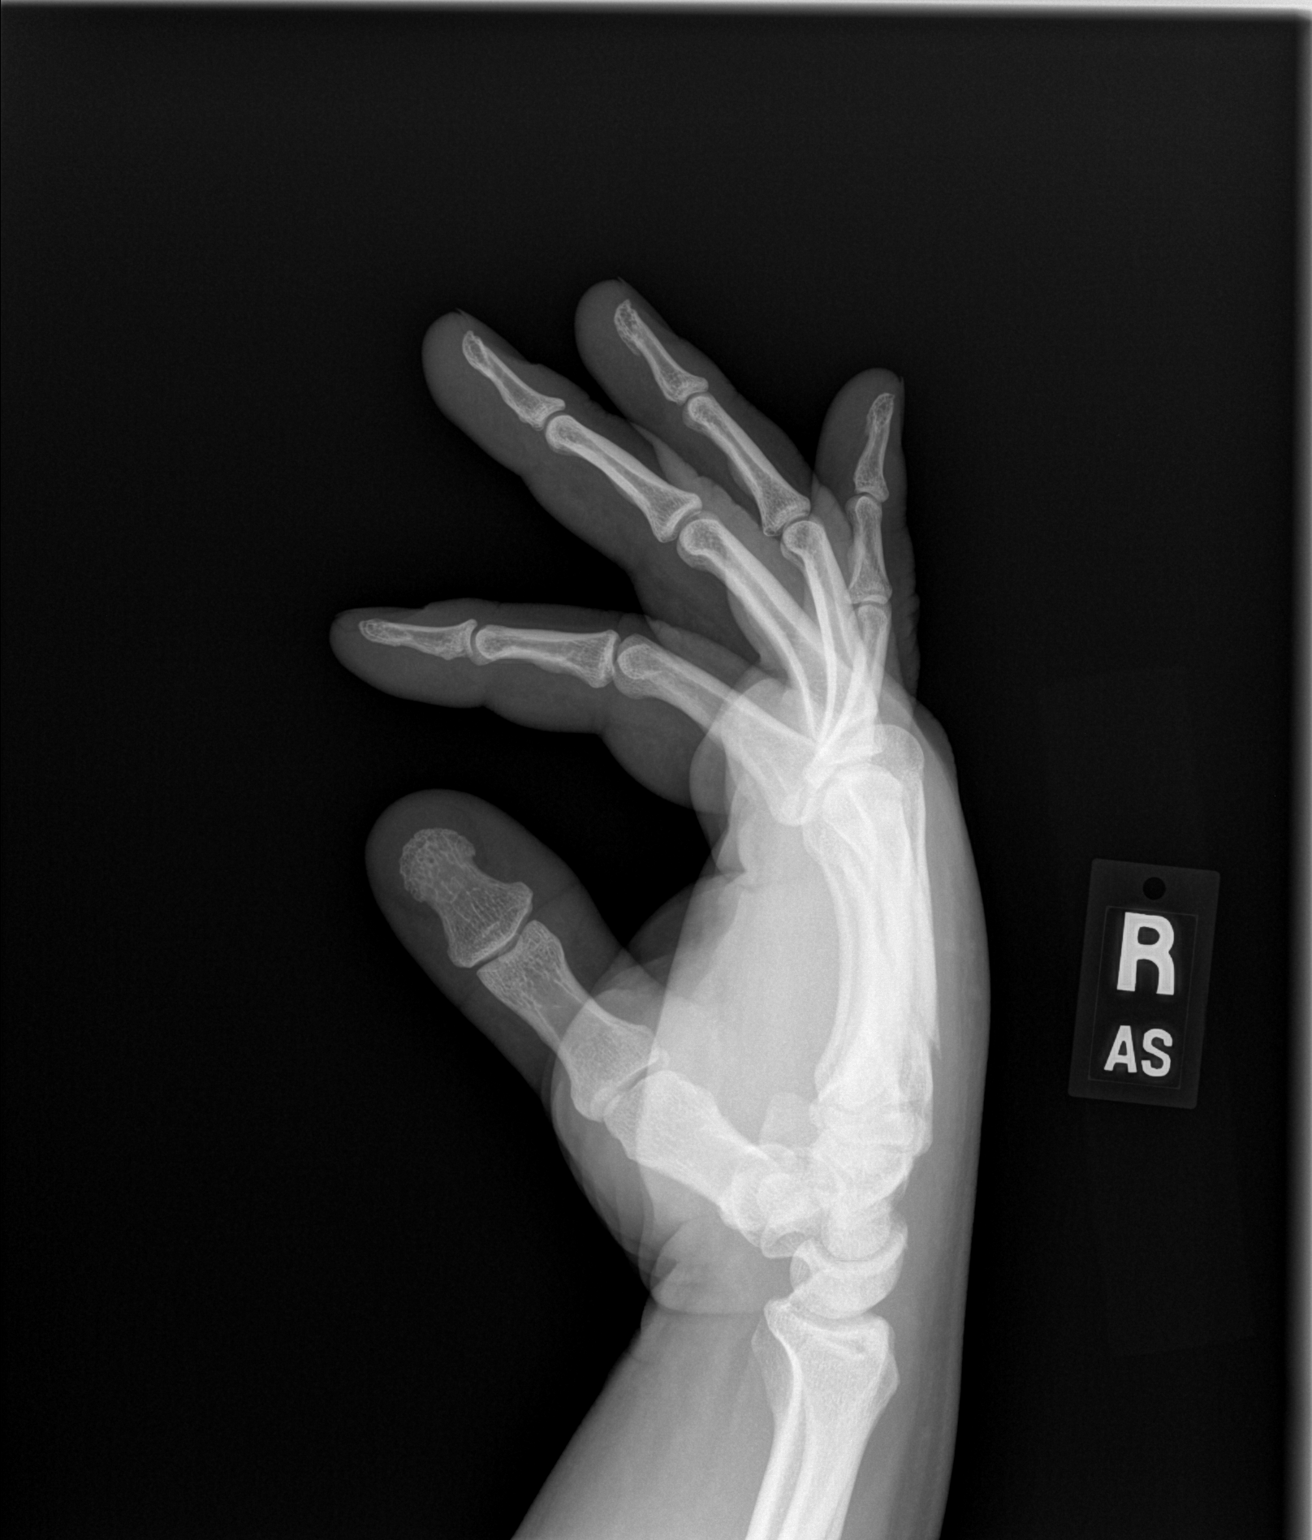

[3 of 3 positions shown; findings below may reference images not displayed]

FINDINGS: There is a slightly displaced spiral fracture of the shaft of the
fourth metacarpal. No angulation. The bones and joints otherwise
appear normal.
IMPRESSION: Slightly displaced spiral fracture of the shaft of the fourth
metacarpal.

## 2022-06-18 DIAGNOSIS — M9901 Segmental and somatic dysfunction of cervical region: Secondary | ICD-10-CM | POA: Diagnosis not present

## 2022-06-18 DIAGNOSIS — M546 Pain in thoracic spine: Secondary | ICD-10-CM | POA: Diagnosis not present

## 2022-06-18 DIAGNOSIS — M9903 Segmental and somatic dysfunction of lumbar region: Secondary | ICD-10-CM | POA: Diagnosis not present

## 2022-06-18 DIAGNOSIS — M5413 Radiculopathy, cervicothoracic region: Secondary | ICD-10-CM | POA: Diagnosis not present

## 2022-06-18 DIAGNOSIS — M9902 Segmental and somatic dysfunction of thoracic region: Secondary | ICD-10-CM | POA: Diagnosis not present

## 2022-08-31 ENCOUNTER — Ambulatory Visit: Payer: Commercial Managed Care - PPO | Admitting: Family Medicine

## 2022-08-31 ENCOUNTER — Ambulatory Visit: Payer: Self-pay

## 2022-08-31 ENCOUNTER — Ambulatory Visit (INDEPENDENT_AMBULATORY_CARE_PROVIDER_SITE_OTHER): Payer: Commercial Managed Care - PPO

## 2022-08-31 ENCOUNTER — Encounter: Payer: Self-pay | Admitting: Family Medicine

## 2022-08-31 VITALS — BP 120/86 | Ht 68.0 in | Wt 235.0 lb

## 2022-08-31 DIAGNOSIS — M79641 Pain in right hand: Secondary | ICD-10-CM

## 2022-08-31 DIAGNOSIS — L03011 Cellulitis of right finger: Secondary | ICD-10-CM

## 2022-08-31 DIAGNOSIS — Z23 Encounter for immunization: Secondary | ICD-10-CM | POA: Diagnosis not present

## 2022-08-31 DIAGNOSIS — M79644 Pain in right finger(s): Secondary | ICD-10-CM | POA: Diagnosis not present

## 2022-08-31 MED ORDER — AMOXICILLIN-POT CLAVULANATE 875-125 MG PO TABS: 1.0000 | ORAL_TABLET | Freq: Two times a day (BID) | ORAL | 0 refills | Status: DC

## 2022-08-31 MED ORDER — TETANUS-DIPHTH-ACELL PERTUSSIS 5-2.5-18.5 LF-MCG/0.5 IM SUSP: 0.5000 mL | Freq: Once | INTRAMUSCULAR | 0 refills | Status: DC

## 2022-08-31 NOTE — Progress Notes (Signed)
  Juan Rodriguez - 42 y.o. male MRN [id number]  Date of birth: December 30, 1980    SUBJECTIVE:      Chief Complaint:/ HPI:    Right hand and finger pain.  4 days ago his dog snapped at him catching his right pinky finger in the mouth of the dog.  Small skin abrasion/bite.  That seem to heal pretty quickly but 2 days ago he developed hand pain in the fourth and fifth metacarpal area with some erythema yesterday.  Pain became so intense he had to take some over-the-counter ibuprofen.  Comes in today with some improvement overnight with continued ibuprofen.   OBJECTIVE: BP 120/86   Ht '5\' 8"'$  (1.727 m)   Wt 235 lb (106.6 kg)   BMI 35.73 kg/m   Physical Exam:  Vital signs are reviewed. GENERAL: Well-developed male no acute distress SKIN: Right hand reveals healing very small abrasion on the dorsum of the fifth finger.  Picture in the photo media tab of the chart.  He also has some erythema more proximally over the hand and it is slightly warm to touch.  Mildly tender to palpation.  He has full range of motion wrist and all fingers.  Normal fist closure and normal grip strength. Vascular: Normal cap refill NEURO: Normal 2 point discrimination ULTRASOUND: Right fourth and fifth fingers and lateral hand: Small amount of fluid in the soft tissue surrounding the tendon sheath.  The tendon sheath are well-seen and there is no defect there is no fluid within the tendon.  There are no defects in the cords cortical portions of the bone.  There is a small amount of fluid in the fourth and fifth MCP joints.  Impression: Subcutaneous fluid, no sign of tenosynovitis.  No sign of cortical disruption or fracture. ASSESSMENT & PLAN: #1.  Hand and finger pain concerning for early cellulitis.   - Ultrasound findings are reassuring as there is no apparent tenosynovitis, no apparent bony defect.   - Unclear if this is related to the dog bite or to other potentially overlooked injury.  He works as in Architect.  Update  tetanus shot.   - Given the erythema, we will treat with 5 days of Augmentin, (concern for early cellulitis)   Scheduled NSAIDs low-dose 400 mg 3 times daily for 3 days.   Red flags such as increasing pain, increasing area of erythema, inability to close fists or fever he will need to be seen urgently.  If is not totally resolved within the 5 days, he will follow-up.  I am not convinced this is related to the episode with the dog other than the fact that that may have left a skin opening for potential other bacterial entry.  The area of erythema is very far removed from the actual abrasion area. No problem-specific Assessment & Plan notes found for this encounter.

## 2022-09-07 NOTE — Progress Notes (Signed)
Tdap vaccination administered by April Manson, CMA per Dr. Verlon Au orders.   Talbot Grumbling, RN

## 2022-10-30 ENCOUNTER — Ambulatory Visit (INDEPENDENT_AMBULATORY_CARE_PROVIDER_SITE_OTHER): Payer: Commercial Managed Care - PPO | Admitting: Family Medicine

## 2022-10-30 ENCOUNTER — Encounter (HOSPITAL_BASED_OUTPATIENT_CLINIC_OR_DEPARTMENT_OTHER): Payer: 59 | Admitting: Family Medicine

## 2022-10-30 ENCOUNTER — Encounter (HOSPITAL_BASED_OUTPATIENT_CLINIC_OR_DEPARTMENT_OTHER): Payer: Self-pay | Admitting: Family Medicine

## 2022-10-30 VITALS — BP 131/102 | HR 70 | Ht 68.0 in | Wt 245.0 lb

## 2022-10-30 DIAGNOSIS — Z0000 Encounter for general adult medical examination without abnormal findings: Secondary | ICD-10-CM

## 2022-10-30 DIAGNOSIS — R030 Elevated blood-pressure reading, without diagnosis of hypertension: Secondary | ICD-10-CM | POA: Diagnosis not present

## 2022-10-30 NOTE — Progress Notes (Signed)
Complete physical exam  Patient: Juan Rodriguez   DOB: 04-Jun-1981   42 y.o. Male  MRN: [id number]  Subjective:    Juan Rodriguez is a 42 y.o. male who presents today for a complete physical exam. He reports consuming a general diet. He tries to consumes more vegetables, as his wife is trying to help him with healthier eating habits. He thinks he knows what is causing his elevated labs-- drinks [facility] throughout the day.  He generally feels well. He reports sleeping well. He does not have additional problems to discuss today.   Depression screenings:    10/30/2022    8:46 AM 10/26/2021    8:41 AM 01/26/2021    4:14 PM  Depression screen PHQ 2/9  Decreased Interest 0 0 0  Down, Depressed, Hopeless 0 0 0  PHQ - 2 Score 0 0 0   Dentist: due, but sees dentist Q6 months  Vision: He had LASEK eye surgery at age 37, sees ophthalmologist Q10 years   Patient Care Team: [place], Blondell Reveal, MD as PCP - General (Family Medicine)   Outpatient Medications Prior to Visit  Medication Sig   [DISCONTINUED] amoxicillin-clavulanate (AUGMENTIN) 875-125 MG tablet Take 1 tablet by mouth 2 (two) times daily.   No facility-administered medications prior to visit.    Review of Systems  Constitutional:  Negative for malaise/fatigue and weight loss.  Eyes:  Negative for blurred vision and double vision.  Respiratory:  Negative for cough and shortness of breath.   Cardiovascular:  Negative for chest pain.  Gastrointestinal:  Negative for abdominal pain, nausea and vomiting.  Genitourinary:  Negative for frequency.  Musculoskeletal:  Negative for myalgias.  Neurological:  Negative for headaches.  Psychiatric/Behavioral:  Negative for depression and suicidal ideas. The patient does not have insomnia.    Objective:    BP (!) 131/102 Comment: recheck  Pulse 70   Ht 5\' 8"  (1.727 m)   Wt 245 lb (111.1 kg)   SpO2 98%   BMI 37.25 kg/m  BP Readings from Last 3 Encounters:  10/30/22 (!) 131/102   08/31/22 120/86  10/26/21 122/82    Physical Exam Constitutional:      Appearance: Normal appearance. He is obese.  HENT:     Right Ear: Tympanic membrane, ear canal and external ear normal.     Left Ear: Tympanic membrane, ear canal and external ear normal.  Eyes:     Extraocular Movements: Extraocular movements intact.     Pupils: Pupils are equal, round, and reactive to light.  Cardiovascular:     Rate and Rhythm: Normal rate and regular rhythm.     Pulses: Normal pulses.     Heart sounds: Normal heart sounds.  Pulmonary:     Effort: Pulmonary effort is normal.     Breath sounds: Normal breath sounds.  Abdominal:     General: Abdomen is protuberant. Bowel sounds are normal.     Palpations: Abdomen is soft.  Musculoskeletal:        General: Normal range of motion.  Skin:    General: Skin is warm and dry.  Neurological:     Mental Status: He is alert.  Psychiatric:        Mood and Affect: Mood normal.        Behavior: Behavior normal.        Thought Content: Thought content normal.        Judgment: Judgment normal.    Assessment & Plan:  Routine Health Maintenance and Physical Exam  Health Maintenance  Topic Date Due   COVID-19 Vaccine (2 - 2023-24 season) 03/30/2022   Hepatitis C Screening: USPSTF Recommendation to screen - Ages 18-79 yo.  10/30/2023*   HIV Screening  10/30/2023*   Flu Shot  02/28/2023   DTaP/Tdap/Td vaccine (2 - Td or Tdap) 08/31/2032   HPV Vaccine  Aged Out  *Topic was postponed. The date shown is not the original due date.   1. Wellness examination Routine HCM labs ordered today. Will notify patient of results.  Recommend heart healthy/Mediterranean diet, with whole grains, fruits, vegetable, fish, lean meats, nuts, and olive oil. Limit salt.  Recommend moderate walking, 3-5 times/week for 30-50 minutes each session. Aim for at least 150 minutes/week. Goal should be pace of 3 miles/hours, or walking 1.5 miles in 30 minutes. Recommend  avoidance of tobacco products. Avoid excess alcohol. Recommend regular dental and vision exams. Always use seatbelt/lap and shoulder restraints. Recommend using smoke alarms and checking batteries at least twice a year. Recommend using sunscreen when outside. Discussed immunization recommendations. Vaccines are UTD.  2. Elevated blood pressure reading in office without diagnosis of hypertension Elevated DBP in office with first and second readings. Recommend checking BP a couple times each week at home. BP log given to patient. Denies chest pain, palpitations, changes in vision, shortness of breath, lower extremity edema, headaches, lightheadedness, weakness, cough.  Information provided about DASH diet. Focus on healthy lifestyle modifications at this time. Educated him about importance of limiting salt and processed foods, decreasing consumption of [facility], and consuming more vegetables and fruits. Advised him to seek emergency services if he had an acute onset of symptoms-- such as chest pain, shortness of breath, palpitations. F/U in 1 month for BP recheck.     Return in about 4 weeks (around 11/27/2022) for BP follow-up .    Les Pou, FNP

## 2022-10-31 LAB — CBC WITH DIFFERENTIAL/PLATELET
Basophils Absolute: 0.1 10*3/uL (ref 0.0–0.2)
Basos: 1 %
EOS (ABSOLUTE): 0.4 10*3/uL (ref 0.0–0.4)
Eos: 6 %
Hematocrit: 46.5 % (ref 37.5–51.0)
Hemoglobin: 16 g/dL (ref 13.0–17.7)
Immature Grans (Abs): 0 10*3/uL (ref 0.0–0.1)
Immature Granulocytes: 0 %
Lymphocytes Absolute: 1.9 10*3/uL (ref 0.7–3.1)
Lymphs: 27 %
MCH: 29.9 pg (ref 26.6–33.0)
MCHC: 34.4 g/dL (ref 31.5–35.7)
MCV: 87 fL (ref 79–97)
Monocytes Absolute: 0.4 10*3/uL (ref 0.1–0.9)
Monocytes: 6 %
Neutrophils Absolute: 4.2 10*3/uL (ref 1.4–7.0)
Neutrophils: 60 %
Platelets: 228 10*3/uL (ref 150–450)
RBC: 5.35 x10E6/uL (ref 4.14–5.80)
RDW: 12.8 % (ref 11.6–15.4)
WBC: 6.9 10*3/uL (ref 3.4–10.8)

## 2022-10-31 LAB — COMPREHENSIVE METABOLIC PANEL
ALT: 26 IU/L (ref 0–44)
AST: 22 IU/L (ref 0–40)
Albumin/Globulin Ratio: 1.5 (ref 1.2–2.2)
Albumin: 4.4 g/dL (ref 4.1–5.1)
Alkaline Phosphatase: 53 IU/L (ref 44–121)
BUN/Creatinine Ratio: 9 (ref 9–20)
BUN: 9 mg/dL (ref 6–24)
Bilirubin Total: 0.8 mg/dL (ref 0.0–1.2)
CO2: 24 mmol/L (ref 20–29)
Calcium: 9.5 mg/dL (ref 8.7–10.2)
Chloride: 102 mmol/L (ref 96–106)
Creatinine, Ser: 1 mg/dL (ref 0.76–1.27)
Globulin, Total: 3 g/dL (ref 1.5–4.5)
Glucose: 96 mg/dL (ref 70–99)
Potassium: 4.7 mmol/L (ref 3.5–5.2)
Sodium: 139 mmol/L (ref 134–144)
Total Protein: 7.4 g/dL (ref 6.0–8.5)
eGFR: 96 mL/min/{1.73_m2} (ref >59)

## 2022-10-31 LAB — LIPID PANEL
Chol/HDL Ratio: 4.7 ratio (ref 0.0–5.0)
Cholesterol, Total: 192 mg/dL (ref 100–199)
HDL: 41 mg/dL (ref >39)
LDL Chol Calc (NIH): 120 mg/dL — ABNORMAL HIGH (ref 0–99)
Triglycerides: 174 mg/dL — ABNORMAL HIGH (ref 0–149)
VLDL Cholesterol Cal: 31 mg/dL (ref 5–40)

## 2022-10-31 LAB — HEMOGLOBIN A1C
Est. average glucose Bld gHb Est-mCnc: 117 mg/dL
Hgb A1c MFr Bld: 5.7 % — ABNORMAL HIGH (ref 4.8–5.6)

## 2022-10-31 LAB — TSH RFX ON ABNORMAL TO FREE T4: TSH: 2.39 u[IU]/mL (ref 0.450–4.500)

## 2022-11-27 ENCOUNTER — Encounter (HOSPITAL_BASED_OUTPATIENT_CLINIC_OR_DEPARTMENT_OTHER): Payer: Self-pay | Admitting: Family Medicine

## 2022-11-27 ENCOUNTER — Ambulatory Visit (INDEPENDENT_AMBULATORY_CARE_PROVIDER_SITE_OTHER): Payer: Commercial Managed Care - PPO | Admitting: Family Medicine

## 2022-11-27 VITALS — BP 137/94 | HR 67 | Ht 68.0 in | Wt 238.3 lb

## 2022-11-27 DIAGNOSIS — R030 Elevated blood-pressure reading, without diagnosis of hypertension: Secondary | ICD-10-CM | POA: Diagnosis not present

## 2022-11-27 NOTE — Progress Notes (Signed)
Established Patient Office Visit   Hypertension, Follow-up  Subjective   Patient ID: Juan Rodriguez, male    DOB: March 22, 1981  Age: 42 y.o. MRN: [id number]  Juan Rodriguez is a 42 yo male patient who presents today for elevated blood pressure follow-up.   At home readings include: Systolic BP 140-146/Diastolic BP 84-86 Quit consumption of soft drinks  Participating in intermittent fasting in the morning  Walking 2-4 miles at least 6 days per week   Saw dentist yesterday and has an abscess in his bone    BP Readings from Last 3 Encounters:  11/27/22 (!) 137/94  10/30/22 (!) 131/102  08/31/22 120/86   Wt Readings from Last 3 Encounters:  11/27/22 238 lb 4.8 oz (108.1 kg)  10/30/22 245 lb (111.1 kg)  08/31/22 235 lb (106.6 kg)     He was last seen for hypertension 4 weeks ago.  BP at that visit was 131/102. Management since that visit includes lifestyle modifications. Not currently taking medication for blood pressure management.   He is following a Regular diet. He is exercising. He does not smoke.  Symptoms: No chest pain No chest pressure  No palpitations No syncope  No dyspnea No orthopnea  No paroxysmal nocturnal dyspnea No lower extremity edema   Pertinent labs Lab Results  Component Value Date   CHOL 192 10/30/2022   HDL 41 10/30/2022   LDLCALC 120 (H) 10/30/2022   TRIG 174 (H) 10/30/2022   CHOLHDL 4.7 10/30/2022   Lab Results  Component Value Date   NA 139 10/30/2022   K 4.7 10/30/2022   CREATININE 1.00 10/30/2022   EGFR 96 10/30/2022   GLUCOSE 96 10/30/2022   TSH 2.390 10/30/2022     The 10-year ASCVD risk score (Arnett DK, et al., 2019) is: 2%  ---------------------------------------------------------------------------------------------------   Review of Systems  Constitutional:  Negative for diaphoresis.  Eyes:  Negative for blurred vision and double vision.  Respiratory:  Negative for cough and shortness of breath.   Cardiovascular:  Negative  for chest pain, palpitations, orthopnea, leg swelling and PND.  Gastrointestinal:  Negative for abdominal pain, nausea and vomiting.  Neurological:  Negative for dizziness, weakness and headaches.      Objective:     BP (!) 137/94 Comment: Repeat BP  Pulse 67   Ht 5\' 8"  (1.727 m)   Wt 238 lb 4.8 oz (108.1 kg)   SpO2 100%   BMI 36.23 kg/m  BP Readings from Last 3 Encounters:  11/27/22 (!) 137/94  10/30/22 (!) 131/102  08/31/22 120/86     Physical Exam Constitutional:      Appearance: Normal appearance.  Cardiovascular:     Rate and Rhythm: Normal rate and regular rhythm.     Pulses: Normal pulses.     Heart sounds: Normal heart sounds.  Pulmonary:     Effort: Pulmonary effort is normal.     Breath sounds: Normal breath sounds.  Neurological:     Mental Status: He is alert.  Psychiatric:        Mood and Affect: Mood normal.        Behavior: Behavior normal.        Thought Content: Thought content normal.        Judgment: Judgment normal.      Assessment & Plan:  1. Elevated blood pressure reading in office without diagnosis of hypertension Patient well-appearing and in no acute distress during visit. Patient reports making lifestyle modifications to help improve blood pressure.  Reports he stopped drinking soda, started to focus on healthy eating, and is walking a few miles each day. Congratulated him on this and encouraged him to continue with the progress. Cardiovascular exam with heart regular rate and rhythm. Normal heart sounds, no murmurs present. No lower extremity edema present. Lungs clear to auscultation bilaterally. Advised patient to continue monitoring blood pressure at home and send a few readings in 4-6 weeks through MyChart. Advised him to return to office sooner if readings steadily increase. Plan to continue to focus on lifestyle modifications and follow-up in 3 months.    Return in about 3 months (around 02/26/2023) for HTN follow-up.    Alyson Reedy, FNP

## 2023-02-26 ENCOUNTER — Ambulatory Visit (HOSPITAL_BASED_OUTPATIENT_CLINIC_OR_DEPARTMENT_OTHER): Payer: Commercial Managed Care - PPO | Admitting: Family Medicine

## 2023-02-27 ENCOUNTER — Encounter (HOSPITAL_BASED_OUTPATIENT_CLINIC_OR_DEPARTMENT_OTHER): Payer: Self-pay | Admitting: Family Medicine

## 2023-02-27 ENCOUNTER — Ambulatory Visit (INDEPENDENT_AMBULATORY_CARE_PROVIDER_SITE_OTHER): Payer: Commercial Managed Care - PPO | Admitting: Family Medicine

## 2023-02-27 VITALS — BP 124/80 | HR 74 | Ht 68.0 in | Wt 223.0 lb

## 2023-02-27 DIAGNOSIS — R030 Elevated blood-pressure reading, without diagnosis of hypertension: Secondary | ICD-10-CM | POA: Insufficient documentation

## 2023-02-27 NOTE — Progress Notes (Signed)
   Established Patient Office Visit  Subjective   Patient ID: Juan Rodriguez, male    DOB: [birth year]  Age: 42 y.o. MRN: [id number]  Juan Rodriguez is a 42 year-old male patient who presents for follow-up of for a history of elevated blood pressures. Patient is not currently taking prescribed medications for HTN. Denies headache, dizziness, CP, SHOB, vision changes. Patient is regularly keeping a check on BP at home. He stopped drinking soda, is walking daily, and has lost 29 lbs since making lifestyle modifications.   Adhering to low sodium diet: yes Exercising Regularly: yes  Blood pressure at home has been 117/125/70-80s.   BP Readings from Last 3 Encounters:  02/27/23 124/80  11/27/22 (!) 137/94  10/30/22 (!) 131/102   Review of Systems  Constitutional:  Positive for weight loss (intentional). Negative for malaise/fatigue.  Respiratory:  Negative for cough and shortness of breath.   Cardiovascular:  Negative for chest pain, palpitations and leg swelling.  Gastrointestinal:  Negative for abdominal pain, nausea and vomiting.  Musculoskeletal:  Negative for myalgias.  Neurological:  Negative for dizziness, weakness and headaches.  Psychiatric/Behavioral:  Negative for depression and suicidal ideas. The patient is not nervous/anxious.      Objective:     BP 124/80   Pulse 74   Ht 5\' 8"  (1.727 m)   Wt 223 lb (101.2 kg)   SpO2 97%   BMI 33.91 kg/m  BP Readings from Last 3 Encounters:  02/27/23 124/80  11/27/22 (!) 137/94  10/30/22 (!) 131/102     Physical Exam Constitutional:      Appearance: Normal appearance.  Cardiovascular:     Rate and Rhythm: Normal rate and regular rhythm.     Pulses: Normal pulses.     Heart sounds: Normal heart sounds.  Pulmonary:     Effort: Pulmonary effort is normal.     Breath sounds: Normal breath sounds.  Neurological:     Mental Status: He is alert.  Psychiatric:        Mood and Affect: Mood normal.        Behavior: Behavior  normal.        Thought Content: Thought content normal.        Judgment: Judgment normal.       Assessment & Plan:   1. Transient elevated blood pressure Patient presents today with normal blood pressure. Patient has had a few elevated blood pressure readings in the office. Patient in no acute distress and is well-appearing. Denies chest pain, shortness of breath, lower extremity edema, vision changes, headaches. Cardiovascular exam with heart regular rate and rhythm. Normal heart sounds, no murmurs present. No lower extremity edema present. Lungs clear to auscultation bilaterally. Advised patient to continue home blood pressure monitoring and to return to office sooner if blood pressure begins to increase greater than 130/80. Follow-up at CPE next year.   Return in about 35 weeks (around 10/30/2023) for Physical with fasting labs.    Alyson Reedy, FNP

## 2023-05-01 ENCOUNTER — Ambulatory Visit (HOSPITAL_BASED_OUTPATIENT_CLINIC_OR_DEPARTMENT_OTHER): Payer: Commercial Managed Care - PPO | Admitting: Family Medicine

## 2023-06-20 ENCOUNTER — Encounter (HOSPITAL_BASED_OUTPATIENT_CLINIC_OR_DEPARTMENT_OTHER): Payer: Self-pay | Admitting: Family Medicine

## 2023-10-09 ENCOUNTER — Ambulatory Visit: Payer: Commercial Managed Care - PPO | Admitting: Dermatology

## 2023-10-23 NOTE — Telephone Encounter (Signed)
error 

## 2023-10-24 ENCOUNTER — Other Ambulatory Visit (HOSPITAL_BASED_OUTPATIENT_CLINIC_OR_DEPARTMENT_OTHER): Payer: Commercial Managed Care - PPO

## 2023-10-24 ENCOUNTER — Other Ambulatory Visit (HOSPITAL_BASED_OUTPATIENT_CLINIC_OR_DEPARTMENT_OTHER): Payer: Commercial Managed Care - PPO | Admitting: Family Medicine

## 2023-10-24 DIAGNOSIS — Z0000 Encounter for general adult medical examination without abnormal findings: Secondary | ICD-10-CM | POA: Diagnosis not present

## 2023-10-25 LAB — COMPREHENSIVE METABOLIC PANEL WITH GFR
ALT: 21 IU/L (ref 0–44)
AST: 25 IU/L (ref 0–40)
Albumin: 4.4 g/dL (ref 4.1–5.1)
Alkaline Phosphatase: 56 IU/L (ref 44–121)
BUN/Creatinine Ratio: 9 (ref 9–20)
BUN: 9 mg/dL (ref 6–24)
Bilirubin Total: 0.7 mg/dL (ref 0.0–1.2)
CO2: 24 mmol/L (ref 20–29)
Calcium: 9.2 mg/dL (ref 8.7–10.2)
Chloride: 103 mmol/L (ref 96–106)
Creatinine, Ser: 1.01 mg/dL (ref 0.76–1.27)
Globulin, Total: 2.5 g/dL (ref 1.5–4.5)
Glucose: 100 mg/dL — ABNORMAL HIGH (ref 70–99)
Potassium: 4.3 mmol/L (ref 3.5–5.2)
Sodium: 141 mmol/L (ref 134–144)
Total Protein: 6.9 g/dL (ref 6.0–8.5)
eGFR: 95 mL/min/{1.73_m2} (ref >59)

## 2023-10-25 LAB — CBC WITH DIFFERENTIAL/PLATELET
Basophils Absolute: 0 10*3/uL (ref 0.0–0.2)
Basos: 1 %
EOS (ABSOLUTE): 0.3 10*3/uL (ref 0.0–0.4)
Eos: 5 %
Hematocrit: 45.6 % (ref 37.5–51.0)
Hemoglobin: 16.1 g/dL (ref 13.0–17.7)
Immature Grans (Abs): 0 10*3/uL (ref 0.0–0.1)
Immature Granulocytes: 0 %
Lymphocytes Absolute: 1.9 10*3/uL (ref 0.7–3.1)
Lymphs: 32 %
MCH: 30.4 pg (ref 26.6–33.0)
MCHC: 35.3 g/dL (ref 31.5–35.7)
MCV: 86 fL (ref 79–97)
Monocytes Absolute: 0.4 10*3/uL (ref 0.1–0.9)
Monocytes: 7 %
Neutrophils Absolute: 3.4 10*3/uL (ref 1.4–7.0)
Neutrophils: 55 %
Platelets: 241 10*3/uL (ref 150–450)
RBC: 5.29 x10E6/uL (ref 4.14–5.80)
RDW: 12.8 % (ref 11.6–15.4)
WBC: 6 10*3/uL (ref 3.4–10.8)

## 2023-10-25 LAB — LIPID PANEL
Chol/HDL Ratio: 4.4 ratio (ref 0.0–5.0)
Cholesterol, Total: 189 mg/dL (ref 100–199)
HDL: 43 mg/dL (ref >39)
LDL Chol Calc (NIH): 131 mg/dL — ABNORMAL HIGH (ref 0–99)
Triglycerides: 79 mg/dL (ref 0–149)
VLDL Cholesterol Cal: 15 mg/dL (ref 5–40)

## 2023-10-25 LAB — HEMOGLOBIN A1C
Est. average glucose Bld gHb Est-mCnc: 117 mg/dL
Hgb A1c MFr Bld: 5.7 % — ABNORMAL HIGH (ref 4.8–5.6)

## 2023-10-25 LAB — TSH RFX ON ABNORMAL TO FREE T4: TSH: 1.72 u[IU]/mL (ref 0.450–4.500)

## 2023-10-29 ENCOUNTER — Other Ambulatory Visit: Payer: Self-pay | Admitting: Family Medicine

## 2023-10-29 ENCOUNTER — Encounter: Payer: Self-pay | Admitting: Family Medicine

## 2023-10-29 ENCOUNTER — Encounter (HOSPITAL_BASED_OUTPATIENT_CLINIC_OR_DEPARTMENT_OTHER): Payer: Self-pay

## 2023-10-30 ENCOUNTER — Encounter (HOSPITAL_BASED_OUTPATIENT_CLINIC_OR_DEPARTMENT_OTHER): Payer: Commercial Managed Care - PPO | Admitting: Family Medicine

## 2023-11-11 ENCOUNTER — Encounter: Payer: Self-pay | Admitting: Dermatology

## 2023-11-11 ENCOUNTER — Ambulatory Visit: Admitting: Dermatology

## 2023-11-11 VITALS — BP 150/95

## 2023-11-11 DIAGNOSIS — L578 Other skin changes due to chronic exposure to nonionizing radiation: Secondary | ICD-10-CM

## 2023-11-11 DIAGNOSIS — L814 Other melanin hyperpigmentation: Secondary | ICD-10-CM | POA: Diagnosis not present

## 2023-11-11 DIAGNOSIS — Z578 Occupational exposure to other risk factors: Secondary | ICD-10-CM

## 2023-11-11 DIAGNOSIS — L309 Dermatitis, unspecified: Secondary | ICD-10-CM

## 2023-11-11 DIAGNOSIS — D1801 Hemangioma of skin and subcutaneous tissue: Secondary | ICD-10-CM | POA: Diagnosis not present

## 2023-11-11 DIAGNOSIS — D229 Melanocytic nevi, unspecified: Secondary | ICD-10-CM

## 2023-11-11 DIAGNOSIS — L299 Pruritus, unspecified: Secondary | ICD-10-CM

## 2023-11-11 DIAGNOSIS — L821 Other seborrheic keratosis: Secondary | ICD-10-CM

## 2023-11-11 DIAGNOSIS — Z1283 Encounter for screening for malignant neoplasm of skin: Secondary | ICD-10-CM

## 2023-11-11 DIAGNOSIS — W908XXA Exposure to other nonionizing radiation, initial encounter: Secondary | ICD-10-CM

## 2023-11-11 DIAGNOSIS — L84 Corns and callosities: Secondary | ICD-10-CM

## 2023-11-11 MED ORDER — CLOBETASOL PROPIONATE 0.05 % EX CREA: 1.0000 | TOPICAL_CREAM | Freq: Two times a day (BID) | CUTANEOUS | 0 refills | Status: AC

## 2023-11-11 NOTE — Patient Instructions (Addendum)
 Hello Juan Rodriguez,   Thank you for visiting today. Here is a summary of the key instructions:  Medications: - Use clobetasol cream for itchy skin on legs   - Apply twice a day for up to 2 weeks   - Stop for at least 2 weeks before using again   - Also use for bug bites, poison ivy, or mosquito bites  Skin Care: - Use CeraVe Anti-Itch lotion daily as a moisturizer   - Find in the First Aid Itch section of the store - Use sunscreen daily, especially on neck, face, and ears - Try different sunscreen samples to find one you like best - Use a sunscreen stick for your face   - Look for CeraVe Invisible Zinc Stick - Use a cream sunscreen for ears, neck, and arms   - Try CeraVe Sheer Moisture (half mineral, half chemical)  Clothing: - Consider UPF protective clothing for outdoor work   - Look for brands like Public affairs consultant  Follow-up: - Return for annual skin check in one year - Come in sooner if you notice:   - Any spot that doesn't heal for more than 2 months   - Any spot that bleeds a lot  We look forward to seeing you at your next visit. If you have any questions or concerns before then, please do not hesitate to contact our office.  Warm regards,  Dr. [facility]             Important Information  Due to recent changes in healthcare laws, you may see results of your pathology and/or laboratory studies on MyChart before the doctors have had a chance to review them. We understand that in some cases there may be results that are confusing or concerning to you. Please understand that not all results are received at the same time and often the doctors may need to interpret multiple results in order to provide you with the best plan of care or course of treatment. Therefore, we ask that you please give [place] 2 business days to thoroughly review all your results before contacting the office for clarification. Should we see a critical lab result, you will be  contacted sooner.   If You Need Anything After Your Visit  If you have any questions or concerns for your doctor, please call our main line at [phone] If no one answers, please leave a voicemail as directed and we will return your call as soon as possible. Messages left after 4 pm will be answered the following business day.   You may also send [place] a message via MyChart. We typically respond to MyChart messages within 1-2 business days.  For prescription refills, please ask your pharmacy to contact our office. Our fax number is [phone].  If you have an urgent issue when the clinic is closed that cannot wait until the next business day, you can page your doctor at the number below.    Please note that while we do our best to be available for urgent issues outside of office hours, we are not available 24/7.   If you have an urgent issue and are unable to reach [place], you may choose to seek medical care at your doctor's office, retail clinic, urgent care center, or emergency room.  If you have a medical emergency, please immediately call 911 or go to the emergency department. In the event of inclement weather, please call our main line at [phone] for an update on the status of  any delays or closures.  Dermatology Medication Tips: Please keep the boxes that topical medications come in in order to help keep track of the instructions about where and how to use these. Pharmacies typically print the medication instructions only on the boxes and not directly on the medication tubes.   If your medication is too expensive, please contact our office at [phone] or send us  a message through MyChart.   We are unable to tell what your co-pay for medications will be in advance as this is different depending on your insurance coverage. However, we may be able to find a substitute medication at lower cost or fill out paperwork to get insurance to cover a needed medication.   If a prior  authorization is required to get your medication covered by your insurance company, please allow us  1-2 business days to complete this process.  Drug prices often vary depending on where the prescription is filled and some pharmacies may offer cheaper prices.  The website www.goodrx.com contains coupons for medications through different pharmacies. The prices here do not account for what the cost may be with help from insurance (it may be cheaper with your insurance), but the website can give you the price if you did not use any insurance.  - You can print the associated coupon and take it with your prescription to the pharmacy.  - You may also stop by our office during regular business hours and pick up a GoodRx coupon card.  - If you need your prescription sent electronically to a different pharmacy, notify our office through [facility] or by phone at [phone]

## 2023-11-11 NOTE — Progress Notes (Signed)
 New Patient Visit   Subjective  Juan Rodriguez is a 43 y.o. male who presents for the following:  Total Body Skin Exam (TBSE)  Patient present today for new patient visit for TBSE.The patient reports he  has spots, moles and lesions to be evaluated, some may be new or changing and the patient may have concern these could be cancer. Patient has previously been treated by a dermatologist.Patient reports he  does not have hx of bx. Patient denies family history of skin cancers. Patient reports throughout his lifetime has had moderate sun exposure. Currently, patient reports if he has excessive sun exposure, he  does apply sunscreen and/or wears protective coverings.  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 He reports chronic itching on his legs, particularly at night, which has been ongoing for some time.  The patient describes the itching as severe enough that he scratches his legs until they bleed. The condition worsens in the winter but never completely resolves. It is primarily localized to one leg and is described as chronically itchy. The patient notes that the itching typically occurs in the afternoon when he sits down after work. He denies any recent changes in laundry detergent or personal care products that might be contributing to the irritation. The patient wears long pants daily for sun protection but does not use compression socks.  Additionally, the patient mentions a hard spot on the bottom of his foot that has been present for an extended period. He has no personal or family history of skin cancer. The patient's wife initiated the appointment due to his outdoor work exposure.     The following portions of the chart were reviewed this encounter and updated as appropriate: medications, allergies, medical history  Review of Systems:  No other skin or systemic complaints except as noted in HPI or Assessment  and Plan.  Objective  Well appearing patient in no apparent distress; mood and affect are within normal limits.  A full examination was performed including scalp, head, eyes, ears, nose, lips, neck, chest, axillae, abdomen, back, buttocks, bilateral upper extremities, bilateral lower extremities, hands, feet, fingers, toes, fingernails, and toenails. All findings within normal limits unless otherwise noted below.   Relevant exam findings are noted in the Assessment and Plan.    Assessment & Plan   LENTIGINES, SEBORRHEIC KERATOSES, HEMANGIOMAS - Benign normal skin lesions - Benign-appearing - Call for any changes  MELANOCYTIC NEVI - Tan-brown and/or pink-flesh-colored symmetric macules and papules - Benign appearing on exam today - Observation - Call clinic for new or changing moles - Recommend daily use of broad spectrum spf 30+ sunscreen to sun-exposed areas.   ACTINIC DAMAGE - Chronic condition, secondary to cumulative UV/sun exposure - diffuse scaly erythematous macules with underlying dyspigmentation - Recommend daily broad spectrum sunscreen SPF 30+ to sun-exposed areas, reapply every 2 hours as needed.  - Staying in the shade or wearing long sleeves, sun glasses (UVA+UVB protection) and wide brim hats (4-inch brim around the entire circumference of the hat) are also recommended for sun protection.  - Call for new or changing lesions.  1. Chronic pruritus of lower extremities - Assessment: Patient reports chronic itching on his legs, particularly worse in winter, but never fully resolving. The condition is localized primarily to one leg. Physical examination reveals evidence of scratching. Differential diagnosis includes nummular dermatitis or nummular eczema, which commonly affects the lower legs in adults over  40. No family history of skin conditions reported. Patient uses fragrance-free laundry detergent and gentle skin care products, reducing the likelihood of contact  dermatitis. - Plan:    Prescribe clobetasol  cream for topical application     - Use twice daily for up to 2 weeks, then discontinue for at least 2 weeks     - Educate on proper use and potential side effects (skin thinning, stretch marks) with prolonged use    Recommend over-the-counter CeraVe Anti-Itch cream (containing pramoxine) for daily use     - Apply as needed for itch relief    Follow up if symptoms persist or worsen  2. Occupational sun exposure - Assessment: Patient works outdoors as a Scientist, clinical (histocompatibility and immunogenetics), increasing risk for sun damage and potential skin cancers. Currently uses protective clothing including long sleeves and a hood. Mild actinic damage noted on neck from sun exposure. No suspicious lesions or pre-cancerous areas identified during full body skin examination. - Plan:    Educate on importance of consistent sun protection to prevent future skin cancers    Recommend daily use of broad-spectrum sunscreen     - Sunscreen stick (e.g., CeraVe Invisible Zinc Stick) for face     - Cream sunscreen (e.g., CeraVe Sheer Moisture) for ears, neck, and arms    Advise on UPF protective clothing options (e.g., K[employer]ee[employer] brand)    Instruct to monitor for any new or changing skin lesions    Schedule annual full body skin examination  3. Callus on foot - Assessment: Patient reports a persistent hard spot on the bottom of his foot. On examination, the lesion appears consistent with a callus, likely due to gait mechanics. No signs of malignancy or infection noted.  - Plan:    Reassure patient about benign nature of the lesion    No specific treatment required at this time    Advise patient to monitor for any changes or discomfort  SKIN CANCER SCREENING PERFORMED TODAY     Return in about 1 year (around 11/10/2024) for TBSE.  Exie Holler, CMA, am acting as scribe for [employer], DO.    Documentation: I have reviewed the above documentation for accuracy  and completeness, and I agree with the above.  Louana Roup, DO

## 2023-11-18 ENCOUNTER — Encounter: Payer: Self-pay | Admitting: Dermatology

## 2023-12-17 ENCOUNTER — Encounter (HOSPITAL_BASED_OUTPATIENT_CLINIC_OR_DEPARTMENT_OTHER): Payer: Self-pay | Admitting: Family Medicine

## 2023-12-17 ENCOUNTER — Ambulatory Visit (INDEPENDENT_AMBULATORY_CARE_PROVIDER_SITE_OTHER): Admitting: Family Medicine

## 2023-12-17 VITALS — BP 130/88 | HR 83 | Ht 68.0 in | Wt 241.9 lb

## 2023-12-17 DIAGNOSIS — R030 Elevated blood-pressure reading, without diagnosis of hypertension: Secondary | ICD-10-CM | POA: Diagnosis not present

## 2023-12-17 DIAGNOSIS — K5792 Diverticulitis of intestine, part unspecified, without perforation or abscess without bleeding: Secondary | ICD-10-CM | POA: Insufficient documentation

## 2023-12-17 DIAGNOSIS — R7303 Prediabetes: Secondary | ICD-10-CM | POA: Diagnosis not present

## 2023-12-17 DIAGNOSIS — Z0000 Encounter for general adult medical examination without abnormal findings: Secondary | ICD-10-CM

## 2023-12-17 HISTORY — DX: Diverticulitis of intestine, part unspecified, without perforation or abscess without bleeding: K57.92

## 2023-12-17 NOTE — Patient Instructions (Addendum)
 Please return in 6 months for a lab visit.   Things to do to keep yourself healthy: - Exercise at least 30-45 minutes a day, 3-4 days a week.  - Eat a low-fat diet with lots of fruits and vegetables, up to 7-9 servings per day.  - Seatbelts can save your life. Wear them always.  - Smoke detectors on every level of your home, check batteries every year.  - Eye Doctor - have an eye exam every 1-2 years  - Safe sex - if you may be exposed to STDs, use a condom.  - No smoking, vaping, or use of any tobacco products.  - Alcohol -  If you drink, do it moderately, less than 2 drinks per day.  - No illegal drug use.  - Depression is common in our stressful world.If you're feeling down or losing interest in things you normally enjoy, please come in for a visit.  - Violence - If anyone is threatening or hurting you, please call immediately.

## 2023-12-17 NOTE — Progress Notes (Signed)
 Subjective:   Juan Rodriguez 1981/01/18 12/17/2023  CC: Chief Complaint  Patient presents with   Annual Exam    Patient is here today for his physical. Denies any main concerns for today's visit.    HPI: Juan Rodriguez is a 43 y.o. male who presents for a routine health maintenance exam.     HEALTH SCREENINGS: - Vision Screening: up to date - Dental Visits: up to date - Testicular Exam: Declined - STD Screening: Declined - PSA (50+): Not applicable  No results found for: "PSA1", "PSA"   - Colonoscopy (45+): 2016; resume screenings at 43 yo due to hx of diverticulitis,  Discussed with patient purpose of the colonoscopy is to detect colon cancer at curable precancerous or early stages  - AAA Screening: Not applicable  Men age 64-75 who have ever smoked - Lung Cancer screening with low-dose CT: Not applicable-  Adults age 74-80 who are current cigarette smokers or quit within the last 15 years. Must have 20 pack year history.   Depression and Anxiety Screen done today and results listed below:     12/17/2023    8:05 AM 10/30/2022    8:46 AM 10/26/2021    8:41 AM 01/26/2021    4:14 PM 01/16/2018    3:17 PM  Depression screen PHQ 2/9  Decreased Interest 0 0 0 0 0  Down, Depressed, Hopeless 0 0 0 0 0  PHQ - 2 Score 0 0 0 0 0  Altered sleeping 0    0  Tired, decreased energy 0    0  Change in appetite 0    0  Feeling bad or failure about yourself  0    0  Trouble concentrating 0    0  Moving slowly or fidgety/restless 0    0  Suicidal thoughts 0    0  PHQ-9 Score 0    0  Difficult doing work/chores Not difficult at all          12/17/2023    8:05 AM  GAD 7 : Generalized Anxiety Score  Nervous, Anxious, on Edge 0  Control/stop worrying 0  Worry too much - different things 0  Trouble relaxing 0  Restless 0  Easily annoyed or irritable 0  Afraid - awful might happen 0  Total GAD 7 Score 0  Anxiety Difficulty Not difficult at all    IMMUNIZATIONS:  - Tdap:  Tetanus vaccination status reviewed: last tetanus booster within 10 years. - Influenza: Postponed to flu season - Pneumovax: Not applicable - Prevnar: Not applicable - Shingrix vaccine (50+): Not applicable   Past medical history, surgical history, medications, allergies, family history and social history reviewed with patient today and changes made to appropriate areas of the chart.   Past Medical History:  Diagnosis Date   Closed fracture of 4th metacarpal 06/22/2019   Diarrhea 01/14/2015   Diverticulitis    Diverticulitis 12/17/2023    Past Surgical History:  Procedure Laterality Date   EYE SURGERY     Lasix   WISDOM TOOTH EXTRACTION      Current Outpatient Medications on File Prior to Visit  Medication Sig   clobetasol  cream (TEMOVATE ) 0.05 % Apply 1 Application topically 2 (two) times daily. Apply to lower leg for 2 weeks then STOP   No current facility-administered medications on file prior to visit.    No Known Allergies   Social History   Socioeconomic History   Marital status: Married    Spouse name:  Not on file   Number of children: 2   Years of education: 14   Highest education level: Associate degree: academic program  Occupational History   Occupation: Location manager  Tobacco Use   Smoking status: Never   Smokeless tobacco: Never  Vaping Use   Vaping status: Never Used  Substance and Sexual Activity   Alcohol use: Not Currently    Comment: rarely   Drug use: No   Sexual activity: Yes  Other Topics Concern   Not on file  Social History Narrative   Fun: Whatever his kids want to do.   Kids ages 2 & 8   Denies religious beliefs effecting health care.    Social Drivers of Corporate investment banker Strain: Low Risk  (11/26/2022)   Overall Financial Resource Strain (CARDIA)    Difficulty of Paying Living Expenses: Not hard at all  Food Insecurity: No Food Insecurity (11/26/2022)   Hunger Vital Sign    Worried About Running Out of Food in  the Last Year: Never true    Ran Out of Food in the Last Year: Never true  Transportation Needs: No Transportation Needs (11/26/2022)   PRAPARE - Administrator, Civil Service (Medical): No    Lack of Transportation (Non-Medical): No  Physical Activity: Sufficiently Active (11/26/2022)   Exercise Vital Sign    Days of Exercise per Week: 5 days    Minutes of Exercise per Session: 80 min  Stress: No Stress Concern Present (11/26/2022)   [employer] of Occupational Health - Occupational Stress Questionnaire    Feeling of Stress : Not at all  Social Connections: Unknown (11/26/2022)   Social Connection and Isolation Panel [NHANES]    Frequency of Communication with Friends and Family: More than three times a week    Frequency of Social Gatherings with Friends and Family: More than three times a week    Attends Religious Services: Patient declined    Database administrator or Organizations: No    Attends Engineer, structural: Not on file    Marital Status: Married  Catering manager Violence: Not on file   Social History   Tobacco Use  Smoking Status Never  Smokeless Tobacco Never   Social History   Substance and Sexual Activity  Alcohol Use Not Currently   Comment: rarely     Family History  Problem Relation Age of Onset   Crohn's disease Mother    Lung cancer Father    Alzheimer's disease Maternal Grandmother    Heart disease Maternal Grandfather    Heart attack Maternal Grandfather    Healthy Paternal Grandmother    Cancer Paternal Grandfather        lung     ROS: Denies fever, fatigue, unexplained weight loss/gain, CP, SHOB, and palpatitations. Denies neurological deficits, gastrointestinal and/or genitourinary complaints, and skin changes.   Objective:   Today's Vitals   12/17/23 0803 12/17/23 0826  BP: (!) 142/91 130/88  Pulse: 83   SpO2: 99%   Weight: 241 lb 14.4 oz (109.7 kg)   Height: 5\' 8"  (1.727 m)     GENERAL APPEARANCE:  Well-appearing, in NAD. Well nourished.  SKIN: Pink, warm and dry. Turgor normal. No rash, lesion, ulceration, or ecchymoses. Hair evenly distributed.  HEENT: HEAD: Normocephalic.  EYES: PERRLA. EOMI. Lids intact w/o defect. Sclera white, Conjunctiva pink w/o exudate.  EARS: External ear w/o redness, swelling, masses or lesions. EAC clear. TM's intact, translucent w/o bulging, appropriate landmarks visualized.  Appropriate acuity to conversational tones.  NOSE: Septum midline w/o deformity. Nares patent, mucosa pink and non-inflamed w/o drainage. No sinus tenderness.  THROAT: Uvula midline. Oropharynx clear. Tonsils non-inflamed w/o exudate. Oral mucosa pink and moist.  NECK: Supple, Trachea midline. Full ROM w/o pain or tenderness. No lymphadenopathy. Thyroid non-tender w/o enlargement or palpable masses.  RESPIRATORY: Chest wall symmetrical w/o masses. Respirations even and non-labored. Breath sounds clear to auscultation bilaterally. No wheezes, rales, rhonchi, or crackles. CARDIAC: S1, S2 present, regular rate and rhythm. No gallops, murmurs, rubs, or clicks. PMI w/o lifts, heaves, or thrills. No carotid bruits. Capillary refill <2 seconds. Peripheral pulses 2+ bilaterally. GI: Abdomen soft w/o distention. Normoactive bowel sounds. No palpable masses or tenderness. No guarding or rebound tenderness. Liver and spleen w/o tenderness or enlargement. No CVA tenderness.  GU: Pt deferred exam. MSK: Muscle tone and strength appropriate for age, w/o atrophy or abnormal movement. EXTREMITIES: Active ROM intact, w/o tenderness, crepitus, or contracture. No obvious joint deformities or effusions. No clubbing, edema, or cyanosis.  NEUROLOGIC: CN's II-XII intact. Motor strength symmetrical with no obvious weakness. No sensory deficits. DTR 2+ symmetric bilaterally. Steady, even gait.  PSYCH/MENTAL STATUS: Alert, oriented x 3. Cooperative, appropriate mood and affect.     Assessment & Plan:  1.  Prediabetes Controlled. Continue diet, exercise and recheck A1c in 34mo.  - Hemoglobin A1c; Future  2. Annual physical exam (Primary) Reviewed pt's lab results, preventative screenings and vaccines. Discussed healthy lifestyle and exercise.   3. Elevated blood pressure reading in office without diagnosis of hypertension Improved with manual recheck. Pt will continue to monitor at home and will continue diet and exercise changes.    Orders Placed This Encounter  Procedures   Hemoglobin A1c    Standing Status:   Future    Expected Date:   06/18/2024    Expiration Date:   12/16/2024    PATIENT COUNSELING: - Encouraged to adjust caloric intake to maintain or achieve ideal body weight, to reduce intake of dietary saturated fat and total fat, to limit sodium intake by avoiding high sodium foods and not adding table salt, and to maintain adequate dietary potassium and calcium preferably from fresh fruits, vegetables, and low-fat dairy products.   - Advised to avoid cigarette smoking. - Discussed with the patient that most people either abstain from alcohol or drink within safe limits (<=14/week and <=4 drinks/occasion for males, <=7/weeks and <= 3 drinks/occasion for females) and that the risk for alcohol disorders and other health effects rises proportionally with the number of drinks per week and how often a drinker exceeds daily limits. - Discussed cessation/primary prevention of drug use and availability of treatment for abuse.   - Stressed the importance of regular exercise - Injury prevention: Discussed safety belts, safety helmets, smoke detector, smoking near bedding or upholstery.  - Dental health: Discussed importance of regular tooth brushing, flossing, and dental visits.  - Sexuality: Discussed sexually transmitted diseases, partner selection, use of condoms, avoidance of unintended pregnancy  and contraceptive alternatives.   NEXT PREVENTATIVE PHYSICAL DUE IN 1 YEAR.  Return for 6  months lab visit only ; 1 yr AE .  Patient to reach out to office if new, worrisome, or unresolved symptoms arise or if no improvement in patient's condition. Patient verbalized understanding and is agreeable to treatment plan. All questions answered to patient's satisfaction.    Nonda Bays, [place]

## 2024-03-20 ENCOUNTER — Encounter: Payer: Self-pay | Admitting: Podiatry

## 2024-03-20 ENCOUNTER — Ambulatory Visit (INDEPENDENT_AMBULATORY_CARE_PROVIDER_SITE_OTHER): Admitting: Podiatry

## 2024-03-20 DIAGNOSIS — B070 Plantar wart: Secondary | ICD-10-CM

## 2024-03-20 NOTE — Patient Instructions (Signed)
 Take dressing off in 8 hours and wash the foot with soap and water. If it is hurting or becomes uncomfortable before the 8 hours, go ahead and remove the bandage and wash the area.  If it blisters, apply antibiotic ointment and a band-aid.  Monitor for any signs/symptoms of infection. Call the office immediately if any occur or go directly to the emergency room. Call with any questions/concerns.

## 2024-03-23 NOTE — Progress Notes (Signed)
    Subjective:  Patient ID: Juan Rodriguez, male    DOB: 26-Feb-1981,  MRN: [id number]  Chief Complaint  Patient presents with   Callouses    Left foot corn/callus x 1 month. 5 pain with pressure. Epsom salt soaks. No improvements. Non diabetic.     Discussed the use of AI scribe software for clinical note transcription with the patient, who gave verbal consent to proceed.  History of Present Illness Juan Rodriguez is a 43 year old male who presents with foot lesions suspected to be calluses or warts.  He has had two lesions on his foot for approximately one month, initially thought to be calluses. The lesions are adjacent to each other and have not improved over time. There is no history of injury or trauma to the area. Soaking the lesions in Epsom salt provided temporary relief, but the condition worsened afterward. No creams or other treatments have been used. There is no bleeding or drainage from the lesions. This is the first occurrence of such lesions for him.      Objective:    Physical Exam General: AAO x3, NAD  Dermatological: On the left foot just distal to the fifth MPJ there are 2 annular hyperkeratotic lesion there is melanotic capillary budding present likely consistent with a verruca.  There is no puncture wound.  No edema, erythema or signs of infection.  No open lesions.  Vascular: Dorsalis Pedis artery and Posterior Tibial artery pedal pulses are 2/4 bilateral with immedate capillary fill time. There is no pain with calf compression, swelling, warmth, erythema.   Neruologic: Grossly intact via light touch bilateral.   Musculoskeletal: Tenderness to the skin lesions.  No other areas of discomfort.  Gait: Unassisted, Nonantalgic.     No images are attached to the encounter.    Results Procedure: Debridement of plantar lesion   Description: Debridement of necrotic tissue over the plantar lesion. Observation of thrombosed capillaries, characteristic of a  verruca (wart).   Assessment:   1. Plantar wart      Plan:  Patient was evaluated and treated and all questions answered.  Assessment and Plan Assessment & Plan Plantar wart of the foot Lesions consistent with plantar warts due to location and black spots. Persistent for a month without improvement from Epsom salt soaks.  - Sharply debrided the lesions without any complications.  Apply cantharone, cover with bandage for 8 hours, wash area. Remove sooner if pain or burning occurs. - Monitor for blistering; apply antibiotic ointment and bandage if present. - Report signs of infection: swelling, redness, drainage. - Provide cushioning pads to reduce tenderness post-treatment. - Instruct on clean footwear to prevent reinfection  - Contact for prescription if warts persist after initial treatment. - Consider procedural removal if warts do not resolve with topical treatment.  Return if symptoms worsen or fail to improve.   Donnice JONELLE Fees DPM

## 2024-06-18 ENCOUNTER — Other Ambulatory Visit (HOSPITAL_BASED_OUTPATIENT_CLINIC_OR_DEPARTMENT_OTHER)

## 2024-12-17 ENCOUNTER — Encounter (HOSPITAL_BASED_OUTPATIENT_CLINIC_OR_DEPARTMENT_OTHER): Admitting: Family Medicine
